# Patient Record
Sex: Female | Born: 1975 | Race: Black or African American | Hispanic: No | Marital: Single | Smoking: Current every day smoker
Health system: Southern US, Community
[De-identification: ages and names within clinical notes are randomized; demographics above are authoritative.]

## PROBLEM LIST (undated history)

## (undated) DIAGNOSIS — K219 Gastro-esophageal reflux disease without esophagitis: Secondary | ICD-10-CM

## (undated) DIAGNOSIS — K279 Peptic ulcer, site unspecified, unspecified as acute or chronic, without hemorrhage or perforation: Secondary | ICD-10-CM

## (undated) DIAGNOSIS — I10 Essential (primary) hypertension: Secondary | ICD-10-CM

## (undated) DIAGNOSIS — D509 Iron deficiency anemia, unspecified: Secondary | ICD-10-CM

## (undated) DIAGNOSIS — N189 Chronic kidney disease, unspecified: Secondary | ICD-10-CM

## (undated) HISTORY — DX: Iron deficiency anemia, unspecified: D50.9

## (undated) HISTORY — DX: Peptic ulcer, site unspecified, unspecified as acute or chronic, without hemorrhage or perforation: K27.9

## (undated) HISTORY — DX: Gastro-esophageal reflux disease without esophagitis: K21.9

---

## 2013-10-13 ENCOUNTER — Emergency Department: Payer: Self-pay | Admitting: Emergency Medicine

## 2013-10-14 LAB — URINALYSIS, COMPLETE
Bacteria: NONE SEEN
Bilirubin,UR: NEGATIVE
Glucose,UR: NEGATIVE mg/dL (ref 0–75)
Ketone: NEGATIVE
Nitrite: NEGATIVE
Ph: 6 (ref 4.5–8.0)
Protein: 100
Specific Gravity: 1.006 (ref 1.003–1.030)
Squamous Epithelial: 5
WBC UR: 27 /HPF (ref 0–5)

## 2013-10-14 LAB — WET PREP, GENITAL

## 2013-10-16 LAB — URINE CULTURE

## 2014-02-24 ENCOUNTER — Emergency Department: Payer: Self-pay | Admitting: Emergency Medicine

## 2016-06-28 ENCOUNTER — Emergency Department
Admission: EM | Admit: 2016-06-28 | Discharge: 2016-06-28 | Disposition: A | Discharge status: Home / Self Care | Payer: Medicaid Other | Attending: Emergency Medicine | Admitting: Emergency Medicine

## 2016-06-28 ENCOUNTER — Emergency Department: Payer: Medicaid Other

## 2016-06-28 ENCOUNTER — Encounter: Payer: Self-pay | Admitting: Emergency Medicine

## 2016-06-28 DIAGNOSIS — R42 Dizziness and giddiness: Secondary | ICD-10-CM | POA: Insufficient documentation

## 2016-06-28 DIAGNOSIS — F17200 Nicotine dependence, unspecified, uncomplicated: Secondary | ICD-10-CM | POA: Insufficient documentation

## 2016-06-28 DIAGNOSIS — I10 Essential (primary) hypertension: Secondary | ICD-10-CM | POA: Diagnosis not present

## 2016-06-28 HISTORY — DX: Essential (primary) hypertension: I10

## 2016-06-28 LAB — COMPREHENSIVE METABOLIC PANEL
ALK PHOS: 79 U/L (ref 38–126)
ALT: 15 U/L (ref 14–54)
ANION GAP: 9 (ref 5–15)
AST: 34 U/L (ref 15–41)
Albumin: 3.7 g/dL (ref 3.5–5.0)
BILIRUBIN TOTAL: 1.2 mg/dL (ref 0.3–1.2)
BUN: 16 mg/dL (ref 6–20)
CALCIUM: 9.4 mg/dL (ref 8.9–10.3)
CO2: 18 mmol/L — ABNORMAL LOW (ref 22–32)
Chloride: 109 mmol/L (ref 101–111)
Creatinine, Ser: 1.99 mg/dL — ABNORMAL HIGH (ref 0.44–1.00)
GFR calc non Af Amer: 30 mL/min — ABNORMAL LOW (ref >60)
GFR, EST AFRICAN AMERICAN: 35 mL/min — AB (ref >60)
Glucose, Bld: 76 mg/dL (ref 65–99)
Potassium: 4.8 mmol/L (ref 3.5–5.1)
Sodium: 136 mmol/L (ref 135–145)
TOTAL PROTEIN: 8.3 g/dL — AB (ref 6.5–8.1)

## 2016-06-28 LAB — CBC
HCT: 33.9 % — ABNORMAL LOW (ref 35.0–47.0)
HEMOGLOBIN: 10.6 g/dL — AB (ref 12.0–16.0)
MCH: 25.3 pg — ABNORMAL LOW (ref 26.0–34.0)
MCHC: 31.3 g/dL — ABNORMAL LOW (ref 32.0–36.0)
MCV: 80.9 fL (ref 80.0–100.0)
Platelets: 355 10*3/uL (ref 150–440)
RBC: 4.19 MIL/uL (ref 3.80–5.20)
RDW: 19 % — ABNORMAL HIGH (ref 11.5–14.5)
WBC: 7.5 10*3/uL (ref 3.6–11.0)

## 2016-06-28 LAB — TROPONIN I

## 2016-06-28 MED ORDER — SODIUM CHLORIDE 0.9 % IV BOLUS (SEPSIS)
1000.0000 mL | Freq: Once | INTRAVENOUS | Status: AC
  Administered 2016-06-28: 1000 mL via INTRAVENOUS

## 2016-06-28 MED ORDER — MECLIZINE HCL 25 MG PO TABS: 25.0000 mg | ORAL_TABLET | Freq: Three times a day (TID) | ORAL | 0 refills | Status: DC | PRN

## 2016-06-28 MED ORDER — MECLIZINE HCL 25 MG PO TABS
ORAL_TABLET | ORAL | Status: AC
  Administered 2016-06-28: 25 mg via ORAL
  Filled 2016-06-28: qty 1

## 2016-06-28 MED ORDER — MECLIZINE HCL 25 MG PO TABS
25.0000 mg | ORAL_TABLET | Freq: Once | ORAL | Status: AC
  Administered 2016-06-28: 25 mg via ORAL

## 2016-06-28 NOTE — ED Notes (Signed)
Patient discharged to home per MD order. Patient in stable condition, and deemed medically cleared by ED provider for discharge. Discharge instructions reviewed with patient/family using "Teach Back"; verbalized understanding of medication education and administration, and information about follow-up care. Denies further concerns. ° °

## 2016-06-28 NOTE — ED Provider Notes (Signed)
[facility] Emergency Department Provider Note   ____________________________________________   I have reviewed the triage vital signs and the nursing notes.   HISTORY  Chief Complaint Dizziness   History limited by: Not Limited   HPI Gina Doyle is a 41 y.o. female who presents to the emergency department today because of concerns for dizziness. The patient states that this started a couple of days ago. She feels like the room is spinning around her. She first noticed in the middle the night when she went up to use the bathroom. Since then it has occurred a few more times. She was able however to go to work today and finish her shift. In addition she states she has had right ear issues, with hearing sounds, which has been going on for a year.   Past Medical History:  Diagnosis Date  . Hypertension     There are no active problems to display for this patient.   History reviewed. No pertinent surgical history.  Prior to Admission medications   Not on File    Allergies Patient has no known allergies.  No family history on file.  Social History Social History  Substance Use Topics  . Smoking status: Current Every Day Smoker    Packs/day: 1.00    Types: Cigarettes  . Smokeless tobacco: Not on file  . Alcohol use Not on file    Review of Systems  Constitutional: Negative for fever. Cardiovascular: Negative for chest pain. Respiratory: Negative for shortness of breath. Gastrointestinal: Negative for abdominal pain, vomiting and diarrhea. Neurological: Positive for dizziness. 10-point ROS otherwise negative.  ____________________________________________   PHYSICAL EXAM:  VITAL SIGNS: ED Triage Vitals  Enc Vitals Group     BP 06/28/16 1806 (!) 154/96     Pulse Rate 06/28/16 1806 80     Resp 06/28/16 1806 20     Temp 06/28/16 1806 98 F (36.7 C)     Temp Source 06/28/16 1806 Oral     SpO2 06/28/16 1806 100 %     Weight 06/28/16  1807 220 lb (99.8 kg)     Height 06/28/16 1807 5\' 10"  (1.778 m)     Head Circumference --      Peak Flow --      Pain Score 06/28/16 1805 8   Constitutional: Alert and oriented. Well appearing and in no distress. Eyes: Conjunctivae are normal. Normal extraocular movements. ENT   Head: Normocephalic and atraumatic.   Nose: No congestion/rhinnorhea.   Mouth/Throat: Mucous membranes are moist.   Neck: No stridor. Hematological/Lymphatic/Immunilogical: No cervical lymphadenopathy. Cardiovascular: Normal rate, regular rhythm.  No murmurs, rubs, or gallops. Respiratory: Normal respiratory effort without tachypnea nor retractions. Breath sounds are clear and equal bilaterally. No wheezes/rales/rhonchi. Gastrointestinal: Soft and non tender. No rebound. No guarding.  Genitourinary: Deferred Musculoskeletal: Normal range of motion in all extremities. No lower extremity edema. Neurologic:  Normal speech and language. No gross focal neurologic deficits are appreciated.  Skin:  Skin is warm, dry and intact. No rash noted. Psychiatric: Mood and affect are normal. Speech and behavior are normal. Patient exhibits appropriate insight and judgment.  ____________________________________________    LABS (pertinent positives/negatives)  Labs Reviewed  COMPREHENSIVE METABOLIC PANEL - Abnormal; Notable for the following:       Result Value   CO2 18 (*)    Creatinine, Ser 1.99 (*)    Total Protein 8.3 (*)    GFR calc non Af Amer 30 (*)    GFR calc  Af Amer 35 (*)    All other components within normal limits  CBC - Abnormal; Notable for the following:    Hemoglobin 10.6 (*)    HCT 33.9 (*)    MCH 25.3 (*)    MCHC 31.3 (*)    RDW 19.0 (*)    All other components within normal limits  TROPONIN I     ____________________________________________   EKG  I, Nance Pear, attending physician, personally viewed and interpreted this EKG  EKG Time: 1937 Rate: 59 Rhythm: sinus  bradycardia Axis: normal Intervals: qtc 409 QRS: narrow ST changes: no st elevation Impression: abnormal ekg   ____________________________________________    RADIOLOGY  CT head  IMPRESSION: No acute intracranial abnormality.  Trace fluid identified right mastoid air cells .   ____________________________________________   PROCEDURES  Procedures  ____________________________________________   INITIAL IMPRESSION / ASSESSMENT AND PLAN / ED COURSE  Pertinent labs & imaging results that were available during my care of the patient were reviewed by me and considered in my medical decision making (see chart for details).  Patient presented to the emergency department today with primary concern for dizziness. It is intermittent. No concerning findings on physical exam. At this point I doubt central lesion given intermittent nature symptoms. She was mildly anemic however don't think it would be enough to cause the patient's symptoms. This point given the additional history of right ear issues like vertigo likely. Patient will be given meclizine. Furthermore patient's creatinine was elevated. I had a discussion with patient about this. Patient states it has happened to her in the past. Patient will follow-up with her primary care doctor to have it rechecked.  ____________________________________________   FINAL CLINICAL IMPRESSION(S) / ED DIAGNOSES  Final diagnoses:  Dizziness     Note: This dictation was prepared with Dragon dictation. Any transcriptional errors that result from this process are unintentional     Nance Pear, MD 06/28/16 2153

## 2016-06-28 NOTE — ED Notes (Signed)
Pt in with co dizziness since Saturday no hx of the same, states no loc. Pt denies any recent illness or cause, also has had some cp denies any at present.

## 2016-06-28 NOTE — ED Triage Notes (Signed)
States 2 days ago got up from bed and felt very dizzy and went to ground. Did not lose consciousness. Today several times has episodes of dizziness when getting up, had to rest for it to pass. Headache began today. Has been hearing strange sounds R ear only for past year.

## 2016-06-28 NOTE — Discharge Instructions (Signed)
Please seek medical attention for any high fevers, chest pain, shortness of breath, change in behavior, persistent vomiting, bloody stool or any other new or concerning symptoms.  

## 2017-07-26 ENCOUNTER — Encounter: Payer: Self-pay | Admitting: Emergency Medicine

## 2017-07-26 ENCOUNTER — Emergency Department
Admission: EM | Admit: 2017-07-26 | Discharge: 2017-07-26 | Disposition: A | Discharge status: Home / Self Care | Payer: Self-pay | Attending: Emergency Medicine | Admitting: Emergency Medicine

## 2017-07-26 DIAGNOSIS — R7989 Other specified abnormal findings of blood chemistry: Secondary | ICD-10-CM

## 2017-07-26 DIAGNOSIS — R748 Abnormal levels of other serum enzymes: Secondary | ICD-10-CM | POA: Insufficient documentation

## 2017-07-26 DIAGNOSIS — R778 Other specified abnormalities of plasma proteins: Secondary | ICD-10-CM

## 2017-07-26 DIAGNOSIS — R0789 Other chest pain: Secondary | ICD-10-CM | POA: Insufficient documentation

## 2017-07-26 DIAGNOSIS — Z79899 Other long term (current) drug therapy: Secondary | ICD-10-CM | POA: Insufficient documentation

## 2017-07-26 DIAGNOSIS — I10 Essential (primary) hypertension: Secondary | ICD-10-CM | POA: Insufficient documentation

## 2017-07-26 DIAGNOSIS — F17210 Nicotine dependence, cigarettes, uncomplicated: Secondary | ICD-10-CM | POA: Insufficient documentation

## 2017-07-26 LAB — COMPREHENSIVE METABOLIC PANEL
ALT: 20 U/L (ref 14–54)
ANION GAP: 5 (ref 5–15)
AST: 27 U/L (ref 15–41)
Albumin: 3.2 g/dL — ABNORMAL LOW (ref 3.5–5.0)
Alkaline Phosphatase: 87 U/L (ref 38–126)
BUN: 16 mg/dL (ref 6–20)
CHLORIDE: 111 mmol/L (ref 101–111)
CO2: 20 mmol/L — ABNORMAL LOW (ref 22–32)
Calcium: 8.8 mg/dL — ABNORMAL LOW (ref 8.9–10.3)
Creatinine, Ser: 1.79 mg/dL — ABNORMAL HIGH (ref 0.44–1.00)
GFR calc Af Amer: 39 mL/min — ABNORMAL LOW (ref >60)
GFR, EST NON AFRICAN AMERICAN: 34 mL/min — AB (ref >60)
Glucose, Bld: 102 mg/dL — ABNORMAL HIGH (ref 65–99)
POTASSIUM: 4.2 mmol/L (ref 3.5–5.1)
Sodium: 136 mmol/L (ref 135–145)
Total Bilirubin: 0.5 mg/dL (ref 0.3–1.2)
Total Protein: 7.4 g/dL (ref 6.5–8.1)

## 2017-07-26 LAB — CBC
HCT: 32.2 % — ABNORMAL LOW (ref 35.0–47.0)
Hemoglobin: 10.5 g/dL — ABNORMAL LOW (ref 12.0–16.0)
MCH: 25.9 pg — ABNORMAL LOW (ref 26.0–34.0)
MCHC: 32.5 g/dL (ref 32.0–36.0)
MCV: 79.6 fL — AB (ref 80.0–100.0)
PLATELETS: 425 10*3/uL (ref 150–440)
RBC: 4.05 MIL/uL (ref 3.80–5.20)
RDW: 19.2 % — AB (ref 11.5–14.5)
WBC: 5.3 10*3/uL (ref 3.6–11.0)

## 2017-07-26 LAB — TROPONIN I
TROPONIN I: 0.04 ng/mL — AB (ref <0.03)
Troponin I: 0.04 ng/mL (ref <0.03)

## 2017-07-26 MED ORDER — ATENOLOL 50 MG PO TABS: 50.0000 mg | ORAL_TABLET | Freq: Every day | ORAL | 11 refills | Status: DC

## 2017-07-26 NOTE — ED Notes (Signed)
While conducting home medication interview, patient reported taking omeprazole for frequent stomach acid but said that it did not seem to help. She inquired as to other possible medications she could use. I advised that there were other OTC medications in the same family as omeprazole (e.g. Nexium and Prevacid), but that no one was necessarily "stronger" than another and that people respond to medications differently. I advised speaking with a PCP or specialist if the problem continued chronically and she indicated understanding.   ** The above is intended solely for informational and/or communicative purposes. It should in no way be considered an endorsement of any specific treatment, therapy or action. **

## 2017-07-26 NOTE — ED Provider Notes (Signed)
[facility] Emergency Department Provider Note  ____________________________________________   First MD Initiated Contact with Patient 07/26/17 1324     (approximate)  I have reviewed the triage vital signs and the nursing notes.   HISTORY  Chief Complaint Breast Pain    HPI Gina Doyle is a 42 y.o. female with PMH as listed below who presents for evaluation of left sided breast vs chest wall pain.  She reports that she has felt similar symptoms in the past but is usually milder.  It is reproducible with moving around her left arm and the left side of her chest and it often feels better if she pushes firmly on the lateral aspect of her left breast.  She does some repetitive motions at work and also lifts up her small child and says that this reproduces the pain as well.  She has no "chest pain on the inside" although the pain is deep in her left chest wall rather than being on the surface.  She denies shortness of breath, fever/chills, nausea, vomiting, and abdominal pain.  She reports that she ran out of her atenolol a couple weeks ago and has no regular doctor to get another prescription so she is worried about her blood pressure.  She does not have any chest tightness or pressure, just the sharp reproducible pain.  It only occurs if she is moving around and it is relatively mild.  Past Medical History:  Diagnosis Date  . Hypertension     There are no active problems to display for this patient.   History reviewed. No pertinent surgical history.  Prior to Admission medications   Medication Sig Start Date End Date Taking? Authorizing Provider  atenolol (TENORMIN) 50 MG tablet Take 50 mg by mouth daily.   Yes [provider]  omeprazole (PRILOSEC OTC) 20 MG tablet Take 20 mg by mouth daily.   Yes [provider]  ranitidine (ZANTAC) 150 MG tablet Take 150 mg by mouth 2 (two) times daily as needed for heartburn.   Yes [provider]  atenolol (TENORMIN) 50 MG tablet Take 1 tablet (50 mg total) by mouth daily. 07/26/17 07/26/18  Hinda Kehr, MD    Allergies Patient has no known allergies.  No family history on file.  Social History Social History   Tobacco Use  . Smoking status: Current Every Day Smoker    Packs/day: 1.00    Types: Cigarettes  Substance Use Topics  . Alcohol use: Not on file  . Drug use: Not on file    Review of Systems Constitutional: No fever/chills Eyes: No visual changes. ENT: No sore throat. Cardiovascular: Left-sided chest wall pain Respiratory: Denies shortness of breath. Gastrointestinal: No abdominal pain.  No nausea, no vomiting.  No diarrhea.  No constipation. Genitourinary: Negative for dysuria. Musculoskeletal: Negative for neck pain.  Negative for back pain. Integumentary: Negative for rash. Neurological: Negative for headaches, focal weakness or numbness.   ____________________________________________   PHYSICAL EXAM:  VITAL SIGNS: ED Triage Vitals  Enc Vitals Group     BP 07/26/17 1030 (!) (P) 151/100     Pulse Rate 07/26/17 1032 (!) 46     Resp 07/26/17 1032 16     Temp 07/26/17 1030 (P) 98.7 F (37.1 C)     Temp Source 07/26/17 1030 (P) Oral     SpO2 07/26/17 1032 100 %     Weight 07/26/17 1039 97.5 kg (215 lb)     Height 07/26/17 1039  1.778 m (5\' 10" )     Head Circumference --      Peak Flow --      Pain Score 07/26/17 1034 8     Pain Loc --      Pain Edu? --      Excl. in [place]? --     Constitutional: Alert and oriented. Well appearing and in no acute distress. Eyes: Conjunctivae are normal.  Head: Atraumatic. Nose: No congestion/rhinnorhea. Mouth/Throat: Mucous membranes are moist. Neck: No stridor.  No meningeal signs.   Cardiovascular: Normal rate, regular rhythm. Good peripheral circulation. Grossly normal heart sounds.  Highly reproducible left lateral chest wall/breast pain.  There is no palpable deformity or mass, no  indication of abscess nor cellulitis, no discharge from the patient's nipple.  The area of tenderness seems to be the muscles within her left chest wall. Respiratory: Normal respiratory effort.  No retractions. Lungs CTAB. Gastrointestinal: Soft and nontender. No distention.  Musculoskeletal: No lower extremity tenderness nor edema. No gross deformities of extremities. Neurologic:  Normal speech and language. No gross focal neurologic deficits are appreciated.  Skin:  Skin is warm, dry and intact. No rash noted. Psychiatric: Mood and affect are normal. Speech and behavior are normal.  ____________________________________________   LABS (all labs ordered are listed, but only abnormal results are displayed)  Labs Reviewed  CBC - Abnormal; Notable for the following components:      Result Value   Hemoglobin 10.5 (*)    HCT 32.2 (*)    MCV 79.6 (*)    MCH 25.9 (*)    RDW 19.2 (*)    All other components within normal limits  COMPREHENSIVE METABOLIC PANEL - Abnormal; Notable for the following components:   CO2 20 (*)    Glucose, Bld 102 (*)    Creatinine, Ser 1.79 (*)    Calcium 8.8 (*)    Albumin 3.2 (*)    GFR calc non Af Amer 34 (*)    GFR calc Af Amer 39 (*)    All other components within normal limits  TROPONIN I - Abnormal; Notable for the following components:   Troponin I 0.04 (*)    All other components within normal limits  TROPONIN I - Abnormal; Notable for the following components:   Troponin I 0.04 (*)    All other components within normal limits   ____________________________________________  EKG  ED ECG REPORT I, Hinda Kehr, the attending physician, personally viewed and interpreted this ECG.  Date: 07/26/2017 EKG Time: 10:37 Rate: 47 Rhythm: sinus bradycardia QRS Axis: normal Intervals: normal ST/T Wave abnormalities: Non-specific ST segment / T-wave changes, but no evidence of acute ischemia. Narrative Interpretation: no evidence of acute  ischemia   ____________________________________________  RADIOLOGY   ED MD interpretation:  No indication for imaging  Official radiology report(s): No results found.  ____________________________________________   PROCEDURES  Critical Care performed: No   Procedure(s) performed:   Procedures   ____________________________________________   INITIAL IMPRESSION / ASSESSMENT AND PLAN / ED COURSE  As part of my medical decision making, I reviewed the following data within the [place] notes reviewed and incorporated, Labs reviewed  and EKG interpreted     Differential diagnosis includes, but is not limited to, musculoskeletal chest wall pain/strain, ACS, pneumothorax, pneumonia, breast abscess, breast cellulitis, etc.  Fortunately the patient's physical exam is strongly consistent with musculoskeletal tenderness.  After we talked a little bit about the repetitive motions of her job and lifting  her child, she agrees this seems most likely.  She is bradycardic but asymptomatic from that and her blood pressure is elevated but she has been off of her atenolol for a while.  She is eating in the exam room some food that her family brought her and is in no apparent distress or discomfort with highly reproducible pain.  I suggested that we add on a troponin which was not done in triage and she said that is fine but she does not want to wait for the results.  I have added on and I will check for the result but given the strong probability of musculoskeletal pain and the fact the patient wants to go, I will not hold her until the result comes back.  I encouraged over-the-counter OTC analgesia and  I gave my usual and customary return precautions.  She understands and agrees with the plan.   Clinical Course as of Jul 27 1647  Tue Jul 26, 2017  1533 The patient is still in the ED.  Her first troponin was 0.04 which I suspect is simply an elevation due to her chronic  kidney disease.  However, I went back and discussed the results and she is comfortable staying for a repeat troponin.  I think that is appropriate as a screening test to make sure it is not going up significantly.  The second troponin has been ordered and I will reassess the appropriate disposition after the result comes back.   [CF]  4132 Second troponin has not changed and is still 0.04.  I am sure this is a result of her chronic kidney disease.  Patient is comfortable with plan for discharge and outpatient follow-up.  I gave my usual customary return precautions.   [CF]    Clinical Course User Index [CF] Hinda Kehr, MD    ____________________________________________  FINAL CLINICAL IMPRESSION(S) / ED DIAGNOSES  Final diagnoses:  Chest wall pain  Essential hypertension  Elevated troponin I level     MEDICATIONS GIVEN DURING THIS VISIT:  Medications - No data to display   ED Discharge Orders        Ordered    atenolol (TENORMIN) 50 MG tablet  Daily     07/26/17 1510       Note:  This document was prepared using Dragon voice recognition software and may include unintentional dictation errors.    Hinda Kehr, MD 07/26/17 [phone]

## 2017-07-26 NOTE — Discharge Instructions (Signed)

## 2017-07-26 NOTE — ED Triage Notes (Signed)
Pt reports last pm started with some pain to her left breast. Pt states the pain is sharp and she has to squeeze it to make it feel better.

## 2017-07-26 NOTE — ED Notes (Signed)
Dr. Karma Greaser aware of troponin of 0.04.

## 2017-09-28 ENCOUNTER — Encounter: Payer: Self-pay | Admitting: Emergency Medicine

## 2017-09-28 ENCOUNTER — Emergency Department
Admission: EM | Admit: 2017-09-28 | Discharge: 2017-09-28 | Disposition: A | Discharge status: Home / Self Care | Payer: Self-pay | Attending: Emergency Medicine | Admitting: Emergency Medicine

## 2017-09-28 DIAGNOSIS — L739 Follicular disorder, unspecified: Secondary | ICD-10-CM | POA: Insufficient documentation

## 2017-09-28 DIAGNOSIS — R238 Other skin changes: Secondary | ICD-10-CM

## 2017-09-28 DIAGNOSIS — Z79899 Other long term (current) drug therapy: Secondary | ICD-10-CM | POA: Insufficient documentation

## 2017-09-28 DIAGNOSIS — F17210 Nicotine dependence, cigarettes, uncomplicated: Secondary | ICD-10-CM | POA: Insufficient documentation

## 2017-09-28 DIAGNOSIS — I10 Essential (primary) hypertension: Secondary | ICD-10-CM | POA: Insufficient documentation

## 2017-09-28 MED ORDER — FLUCONAZOLE 150 MG PO TABS: 150.0000 mg | ORAL_TABLET | Freq: Every day | ORAL | 0 refills | Status: DC

## 2017-09-28 MED ORDER — CEPHALEXIN 500 MG PO CAPS: 500.0000 mg | ORAL_CAPSULE | Freq: Three times a day (TID) | ORAL | 0 refills | Status: DC

## 2017-09-28 NOTE — ED Triage Notes (Signed)
Pt reports abscess to left labia for the past few weeks painful now. Pt admits to trying to mess with it. Denies fevers.

## 2017-09-28 NOTE — ED Provider Notes (Signed)
[facility] Emergency Department Provider Note   ____________________________________________   First MD Initiated Contact with Patient 09/28/17 1406     (approximate)  I have reviewed the triage vital signs and the nursing notes.   HISTORY  Chief Complaint Abscess   HPI Gina Doyle is a 42 y.o. female 0 complaint of a small abscess that has been there for approximately 3 weeks.  Patient admits she put a pin in the area.  She is also been using warm compresses and also sitting in a tub water.  She denies any fever or chills.  She states that she is not seen any drainage from this area.   Past Medical History:  Diagnosis Date  . Hypertension     There are no active problems to display for this patient.   History reviewed. No pertinent surgical history.  Prior to Admission medications   Medication Sig Start Date End Date Taking? Authorizing Provider  atenolol (TENORMIN) 50 MG tablet Take 1 tablet (50 mg total) by mouth daily. 07/26/17 07/26/18  Hinda Kehr, MD  atenolol (TENORMIN) 50 MG tablet Take 50 mg by mouth daily.    [provider]  cephALEXin (KEFLEX) 500 MG capsule Take 1 capsule (500 mg total) by mouth 3 (three) times daily. 09/28/17   Johnn Hai, PA-C  fluconazole (DIFLUCAN) 150 MG tablet Take 1 tablet (150 mg total) by mouth daily. 09/28/17   Johnn Hai, PA-C  omeprazole (PRILOSEC OTC) 20 MG tablet Take 20 mg by mouth daily.    [provider]  ranitidine (ZANTAC) 150 MG tablet Take 150 mg by mouth 2 (two) times daily as needed for heartburn.    [provider]    Allergies Patient has no known allergies.  No family history on file.  Social History Social History   Tobacco Use  . Smoking status: Current Every Day Smoker    Packs/day: 1.00    Types: Cigarettes  Substance Use Topics  . Alcohol use: Not on file  . Drug use: Not on file    Review of Systems Constitutional: No  fever/chills Cardiovascular: Denies chest pain. Respiratory: Denies shortness of breath. Skin: Positive for questionable abscess. ____________________________________________   PHYSICAL EXAM:  VITAL SIGNS: ED Triage Vitals  Enc Vitals Group     BP 09/28/17 1348 (!) 151/95     Pulse Rate 09/28/17 1348 61     Resp 09/28/17 1348 20     Temp 09/28/17 1348 98.4 F (36.9 C)     Temp Source 09/28/17 1348 Oral     SpO2 09/28/17 1348 99 %     Weight 09/28/17 1349 207 lb (93.9 kg)     Height 09/28/17 1349 5\' 10"  (1.778 m)     Head Circumference --      Peak Flow --      Pain Score 09/28/17 1348 8     Pain Loc --      Pain Edu? --      Excl. in [place]? --     Constitutional: Alert and oriented. Well appearing and in no acute distress. Eyes: Conjunctivae are normal. PERRL. EOMI. Head: Atraumatic. Neck: No stridor.   Cardiovascular: Normal rate, regular rhythm. Grossly normal heart sounds.  Good peripheral circulation. Respiratory: Normal respiratory effort.  No retractions. Lungs CTAB. Musculoskeletal: Moves upper and lower extremities without any difficulty.  Normal gait was noted. Neurologic:  Normal speech and language. No gross focal neurologic deficits are appreciated.  Skin:  Skin is warm, dry and intact.  There is a single papule non-erythematous lateral to superior labia and between the upper left thigh.  No abscess was appreciated.  Area was palpated and no drainage is seen. Psychiatric: Mood and affect are normal. Speech and behavior are normal.  ____________________________________________   LABS (all labs ordered are listed, but only abnormal results are displayed)  Labs Reviewed - No data to display  PROCEDURES  Procedure(s) performed: None  Procedures  Critical Care performed: No  ____________________________________________   INITIAL IMPRESSION / ASSESSMENT AND PLAN / ED COURSE  As part of my medical decision making, I reviewed the following data within the  electronic MEDICAL RECORD NUMBER Notes from prior ED visits and [place] Controlled Substance Database  Patient is encouraged to use warm compresses to the area frequently.  She was warned not to make these compresses to hot avoid burning.  She was also given a prescription for Keflex 500 mg 3 times daily for 7 days.  A Diflucan tablet was written also in the event that she develops a yeast infection.  She was given a list of clinics in the area that her on a sliding scale to establish primary care.  ____________________________________________   FINAL CLINICAL IMPRESSION(S) / ED DIAGNOSES  Final diagnoses:  Perifollicular papule     ED Discharge Orders        Ordered    cephALEXin (KEFLEX) 500 MG capsule  3 times daily     09/28/17 1440    fluconazole (DIFLUCAN) 150 MG tablet  Daily     09/28/17 1456       Note:  This document was prepared using Dragon voice recognition software and may include unintentional dictation errors.    Johnn Hai, PA-C 09/28/17 1528    Earleen Newport, MD 09/29/17 1134

## 2017-09-28 NOTE — Discharge Instructions (Signed)
Apply warm moist heat to the area frequently.  Begin taking your antibiotics for the next 7 days.  Do not stop taking these if areas starts showing improvement.  You may take Tylenol or ibuprofen as needed for pain.  [facility] with [facility], [facility] or other clinics listed on your discharge papers.

## 2017-11-27 ENCOUNTER — Emergency Department
Admission: EM | Admit: 2017-11-27 | Discharge: 2017-11-27 | Disposition: A | Discharge status: Home / Self Care | Payer: Self-pay | Attending: Emergency Medicine | Admitting: Emergency Medicine

## 2017-11-27 ENCOUNTER — Other Ambulatory Visit: Payer: Self-pay

## 2017-11-27 ENCOUNTER — Encounter: Payer: Self-pay | Admitting: Emergency Medicine

## 2017-11-27 ENCOUNTER — Emergency Department: Payer: Self-pay

## 2017-11-27 DIAGNOSIS — I10 Essential (primary) hypertension: Secondary | ICD-10-CM | POA: Insufficient documentation

## 2017-11-27 DIAGNOSIS — Z79899 Other long term (current) drug therapy: Secondary | ICD-10-CM | POA: Insufficient documentation

## 2017-11-27 DIAGNOSIS — R51 Headache: Secondary | ICD-10-CM | POA: Insufficient documentation

## 2017-11-27 DIAGNOSIS — F17210 Nicotine dependence, cigarettes, uncomplicated: Secondary | ICD-10-CM | POA: Insufficient documentation

## 2017-11-27 DIAGNOSIS — R519 Headache, unspecified: Secondary | ICD-10-CM

## 2017-11-27 DIAGNOSIS — R001 Bradycardia, unspecified: Secondary | ICD-10-CM | POA: Insufficient documentation

## 2017-11-27 LAB — COMPREHENSIVE METABOLIC PANEL
ALBUMIN: 3.1 g/dL — AB (ref 3.5–5.0)
ALT: 10 U/L (ref 0–44)
ANION GAP: 7 (ref 5–15)
AST: 15 U/L (ref 15–41)
Alkaline Phosphatase: 76 U/L (ref 38–126)
BUN: 26 mg/dL — AB (ref 6–20)
CHLORIDE: 114 mmol/L — AB (ref 98–111)
CO2: 20 mmol/L — ABNORMAL LOW (ref 22–32)
Calcium: 8.9 mg/dL (ref 8.9–10.3)
Creatinine, Ser: 2.28 mg/dL — ABNORMAL HIGH (ref 0.44–1.00)
GFR calc Af Amer: 29 mL/min — ABNORMAL LOW (ref >60)
GFR calc non Af Amer: 25 mL/min — ABNORMAL LOW (ref >60)
GLUCOSE: 83 mg/dL (ref 70–99)
POTASSIUM: 4.3 mmol/L (ref 3.5–5.1)
Sodium: 141 mmol/L (ref 135–145)
Total Bilirubin: 0.4 mg/dL (ref 0.3–1.2)
Total Protein: 7.4 g/dL (ref 6.5–8.1)

## 2017-11-27 LAB — CBC WITH DIFFERENTIAL/PLATELET
BASOS ABS: 0 10*3/uL (ref 0–0.1)
BASOS PCT: 1 %
EOS ABS: 0.3 10*3/uL (ref 0–0.7)
EOS PCT: 4 %
HCT: 31.5 % — ABNORMAL LOW (ref 35.0–47.0)
Hemoglobin: 10.1 g/dL — ABNORMAL LOW (ref 12.0–16.0)
LYMPHS ABS: 2.1 10*3/uL (ref 1.0–3.6)
LYMPHS PCT: 35 %
MCH: 25.7 pg — ABNORMAL LOW (ref 26.0–34.0)
MCHC: 32.1 g/dL (ref 32.0–36.0)
MCV: 80.2 fL (ref 80.0–100.0)
MONOS PCT: 9 %
Monocytes Absolute: 0.5 10*3/uL (ref 0.2–0.9)
Neutro Abs: 3.2 10*3/uL (ref 1.4–6.5)
Neutrophils Relative %: 51 %
PLATELETS: 447 10*3/uL — AB (ref 150–440)
RBC: 3.93 MIL/uL (ref 3.80–5.20)
RDW: 20 % — AB (ref 11.5–14.5)
WBC: 6.2 10*3/uL (ref 3.6–11.0)

## 2017-11-27 MED ORDER — METOCLOPRAMIDE HCL 5 MG/ML IJ SOLN
10.0000 mg | Freq: Once | INTRAMUSCULAR | Status: AC
  Administered 2017-11-27: 10 mg via INTRAVENOUS
  Filled 2017-11-27: qty 2

## 2017-11-27 MED ORDER — DIPHENHYDRAMINE HCL 50 MG/ML IJ SOLN
25.0000 mg | Freq: Once | INTRAMUSCULAR | Status: AC
  Administered 2017-11-27: 25 mg via INTRAVENOUS
  Filled 2017-11-27: qty 1

## 2017-11-27 MED ORDER — METOCLOPRAMIDE HCL 10 MG PO TABS: 10.0000 mg | ORAL_TABLET | Freq: Three times a day (TID) | ORAL | 0 refills | Status: DC | PRN

## 2017-11-27 NOTE — ED Triage Notes (Signed)
Pt to ED via POV c/o migraine. Pt states that she has been having a migraine every day for the last month. Pt states that she takes OTC medication for the migraine and it will go away but then comes back. Pt also c/o fatigue. Pt states that this has been going on for several weeks. Pt noted to be bradycardic in triage at 44, pt states that she take Atenolol 50 mg QHS. Pt states that she has been on this dosage for several years. Pt is in NAD at this time.

## 2017-11-27 NOTE — ED Notes (Signed)
Patient reports her headache is better - she is awake and alert and oriented

## 2017-11-27 NOTE — ED Triage Notes (Signed)
First Nurse Note:  C/O migraine headache to left posterior head x 3 weeks.  States pain has been constant, only lessens briefly after taking "something" for pain.  Patient is AAOx3.  Skin warm and dry.  MAE equally and strong. Gait steady.  MAE equally and strong.

## 2017-11-27 NOTE — ED Provider Notes (Signed)
[facility] Emergency Department Provider Note ____________________________________________   First MD Initiated Contact with Patient 11/27/17 1155     (approximate)  I have reviewed the triage vital signs and the nursing notes.   HISTORY  Chief Complaint Migraine    HPI Gina Doyle is a 42 y.o. female with PMH as noted below who presents with headache, left-sided, Cipro, and occurring over approximately the last 3 weeks.  The patient states that it is briefly resolved with over-the-counter medications but then returns.  She has had similar headaches before but never lasting nearly this long.  She reports that it is throbbing, and associated with nausea but no vomiting and no photophobia.  No trauma to the area.  Patient also reports a feeling of a rattle in her chest when she breathes at night over the last few weeks.  She denies shortness of breath or chest pain.   Past Medical History:  Diagnosis Date  . Hypertension     There are no active problems to display for this patient.   History reviewed. No pertinent surgical history.  Prior to Admission medications   Medication Sig Start Date End Date Taking? Authorizing Provider  atenolol (TENORMIN) 50 MG tablet Take 1 tablet (50 mg total) by mouth daily. 07/26/17 07/26/18  Hinda Kehr, MD  atenolol (TENORMIN) 50 MG tablet Take 50 mg by mouth daily.    [provider]  cephALEXin (KEFLEX) 500 MG capsule Take 1 capsule (500 mg total) by mouth 3 (three) times daily. 09/28/17   Johnn Hai, PA-C  fluconazole (DIFLUCAN) 150 MG tablet Take 1 tablet (150 mg total) by mouth daily. 09/28/17   Johnn Hai, PA-C  metoCLOPramide (REGLAN) 10 MG tablet Take 1 tablet (10 mg total) by mouth every 8 (eight) hours as needed for up to 5 days for nausea (or headache). 11/27/17 12/02/17  Arta Silence, MD  omeprazole (PRILOSEC OTC) 20 MG tablet Take 20 mg by mouth daily.    [provider]    ranitidine (ZANTAC) 150 MG tablet Take 150 mg by mouth 2 (two) times daily as needed for heartburn.    [provider]    Allergies Patient has no known allergies.  No family history on file.  Social History Social History   Tobacco Use  . Smoking status: Current Every Day Smoker    Packs/day: 1.00    Types: Cigarettes  . Smokeless tobacco: Never Used  Substance Use Topics  . Alcohol use: Not Currently  . Drug use: Not Currently    Review of Systems  Constitutional: No fever.  Positive for fatigue. Eyes: No visual changes. ENT: No sore throat. Cardiovascular: Denies chest pain. Respiratory: Denies shortness of breath. Gastrointestinal: Positive for nausea. Genitourinary: Negative for flank pain.  Musculoskeletal: Negative for back pain. Skin: Negative for rash. Neurological: Positive for headache.   ____________________________________________   PHYSICAL EXAM:  VITAL SIGNS: ED Triage Vitals  Enc Vitals Group     BP 11/27/17 1138 (!) 171/90     Pulse Rate 11/27/17 1138 (!) 44     Resp 11/27/17 1138 14     Temp 11/27/17 1138 98.5 F (36.9 C)     Temp Source 11/27/17 1138 Oral     SpO2 11/27/17 1138 99 %     Weight 11/27/17 1138 205 lb (93 kg)     Height 11/27/17 1138 5\' 10"  (1.778 m)     Head Circumference --      Peak Flow --  Pain Score 11/27/17 1144 7     Pain Loc --      Pain Edu? --      Excl. in [place]? --     Constitutional: Alert and oriented. Well appearing and in no acute distress. Eyes: Conjunctivae are normal.  EOMI.  PERRLA. Head: Atraumatic.  Mild tenderness to left occipital area and neck. Nose: No congestion/rhinnorhea. Mouth/Throat: Mucous membranes are moist.   Neck: Normal range of motion.  Cardiovascular: Normal rate, regular rhythm. Grossly normal heart sounds.  Good peripheral circulation. Respiratory: Normal respiratory effort.  No retractions. Lungs CTAB. Gastrointestinal: No distention.  Musculoskeletal:  Extremities warm and well perfused.  Neurologic:  Normal speech and language.  Cranial nerves III through XII intact.  Motor and sensory intact in all extremities.  Normal coordination.  No gross focal neurologic deficits are appreciated.  Skin:  Skin is warm and dry. No rash noted. Psychiatric: Mood and affect are normal. Speech and behavior are normal.  ____________________________________________   LABS (all labs ordered are listed, but only abnormal results are displayed)  Labs Reviewed  CBC WITH DIFFERENTIAL/PLATELET - Abnormal; Notable for the following components:      Result Value   Hemoglobin 10.1 (*)    HCT 31.5 (*)    MCH 25.7 (*)    RDW 20.0 (*)    Platelets 447 (*)    All other components within normal limits  COMPREHENSIVE METABOLIC PANEL - Abnormal; Notable for the following components:   Chloride 114 (*)    CO2 20 (*)    BUN 26 (*)    Creatinine, Ser 2.28 (*)    Albumin 3.1 (*)    GFR calc non Af Amer 25 (*)    GFR calc Af Amer 29 (*)    All other components within normal limits   ____________________________________________  EKG  ED ECG REPORT I, Arta Silence, the attending physician, personally viewed and interpreted this ECG.  Date: 11/27/2017 EKG Time: 1144 Rate: 41 Rhythm: Sinus bradycardia QRS Axis: normal Intervals: normal ST/T Wave abnormalities: normal Narrative Interpretation: no evidence of acute ischemia  ____________________________________________  RADIOLOGY  CT head: No ICH or other acute abnormality CXR: No focal infiltrate  ____________________________________________   PROCEDURES  Procedure(s) performed: No  Procedures  Critical Care performed: No ____________________________________________   INITIAL IMPRESSION / ASSESSMENT AND PLAN / ED COURSE  Pertinent labs & imaging results that were available during my care of the patient were reviewed by me and considered in my medical decision making (see chart for  details).  42 year old female with PMH of hypertension on atenolol presents with occipital left-sided headache over the last several weeks as well as with some sensation of rattling or crackling in her chest when she breathes at night.  On exam, the patient is relatively well-appearing.  She is bradycardic and somewhat hypertensive but her other vital signs are normal.  Neuro exam is nonfocal.  No meningeal signs.  Overall presentation is most consistent with migraine versus tension headache.  Given the duration of the symptoms I will obtain a CT, as well as basic labs and give Reglan and Benadryl for symptomatic treatment.  Because of the patient's respiratory symptoms I will obtain a chest x-ray although her lung exam is normal.  EKG shows sinus bradycardia.  I suspect that this is most likely due to atenolol.  I reviewed prior records in Epic, and the patient's heart rate has been in the 40s previously.  This could be contributing to her fatigue.  There is no indication for acute intervention.  ----------------------------------------- 3:27 PM on 11/27/2017 -----------------------------------------  The patient is feeling much better after Reglan and Benadryl.  CT head and chest x-ray are negative.  The lab work-up is unremarkable.  Her heart rate has gone up to the 50s and then settled back in the 40s but she continues to be asymptomatic from this.  At this time there is no indication for further work-up or observation since her other vital signs are stable.  I instructed her to call her regular doctor tomorrow to arrange for follow-up, and to discontinue the atenolol in the meantime.  At this time she is comfortable and would like to go home.  Return precautions given, and she expresses understanding. ____________________________________________   FINAL CLINICAL IMPRESSION(S) / ED DIAGNOSES  Final diagnoses:  Acute nonintractable headache, unspecified headache type  Bradycardia       NEW MEDICATIONS STARTED DURING THIS VISIT:  New Prescriptions   METOCLOPRAMIDE (REGLAN) 10 MG TABLET    Take 1 tablet (10 mg total) by mouth every 8 (eight) hours as needed for up to 5 days for nausea (or headache).     Note:  This document was prepared using Dragon voice recognition software and may include unintentional dictation errors.    Arta Silence, MD 11/27/17 [phone]

## 2017-11-27 NOTE — Discharge Instructions (Signed)
Call your primary care doctor tomorrow to arrange for a follow-up appointment as soon as possible.  Your heart rate is very low and I am concerned that this is a side effect of your atenolol.  You may need to be switched to a different blood pressure medication.  We recommend to stop the atenolol until you speak to your doctor.  You can take the Reglan as needed for headache or nausea.  We have provided you with a referral to a neurologist as well.  Return to the ER for new, worsening, persistent severe headache, shortness of breath, lightheadedness, weakness, or any other new or worsening symptoms that concern you.

## 2018-04-19 ENCOUNTER — Emergency Department: Payer: Self-pay

## 2018-04-19 ENCOUNTER — Emergency Department
Admission: EM | Admit: 2018-04-19 | Discharge: 2018-04-19 | Disposition: A | Discharge status: Home / Self Care | Payer: Self-pay | Attending: Emergency Medicine | Admitting: Emergency Medicine

## 2018-04-19 ENCOUNTER — Other Ambulatory Visit: Payer: Self-pay

## 2018-04-19 DIAGNOSIS — L0291 Cutaneous abscess, unspecified: Secondary | ICD-10-CM

## 2018-04-19 DIAGNOSIS — N764 Abscess of vulva: Secondary | ICD-10-CM | POA: Insufficient documentation

## 2018-04-19 DIAGNOSIS — I10 Essential (primary) hypertension: Secondary | ICD-10-CM | POA: Insufficient documentation

## 2018-04-19 DIAGNOSIS — F17210 Nicotine dependence, cigarettes, uncomplicated: Secondary | ICD-10-CM | POA: Insufficient documentation

## 2018-04-19 LAB — COMPREHENSIVE METABOLIC PANEL
ALT: 12 U/L (ref 0–44)
ANION GAP: 6 (ref 5–15)
AST: 19 U/L (ref 15–41)
Albumin: 3.2 g/dL — ABNORMAL LOW (ref 3.5–5.0)
Alkaline Phosphatase: 74 U/L (ref 38–126)
BUN: 17 mg/dL (ref 6–20)
CHLORIDE: 107 mmol/L (ref 98–111)
CO2: 22 mmol/L (ref 22–32)
Calcium: 8.8 mg/dL — ABNORMAL LOW (ref 8.9–10.3)
Creatinine, Ser: 2.25 mg/dL — ABNORMAL HIGH (ref 0.44–1.00)
GFR calc Af Amer: 30 mL/min — ABNORMAL LOW (ref >60)
GFR, EST NON AFRICAN AMERICAN: 26 mL/min — AB (ref >60)
Glucose, Bld: 69 mg/dL — ABNORMAL LOW (ref 70–99)
POTASSIUM: 3.7 mmol/L (ref 3.5–5.1)
Sodium: 135 mmol/L (ref 135–145)
Total Bilirubin: 0.4 mg/dL (ref 0.3–1.2)
Total Protein: 7.8 g/dL (ref 6.5–8.1)

## 2018-04-19 LAB — CBC WITH DIFFERENTIAL/PLATELET
Abs Immature Granulocytes: 0.02 10*3/uL (ref 0.00–0.07)
BASOS ABS: 0 10*3/uL (ref 0.0–0.1)
BASOS PCT: 0 %
EOS ABS: 0.1 10*3/uL (ref 0.0–0.5)
EOS PCT: 2 %
HEMATOCRIT: 31.2 % — AB (ref 36.0–46.0)
Hemoglobin: 9.1 g/dL — ABNORMAL LOW (ref 12.0–15.0)
IMMATURE GRANULOCYTES: 0 %
LYMPHS ABS: 1.6 10*3/uL (ref 0.7–4.0)
Lymphocytes Relative: 22 %
MCH: 22.6 pg — ABNORMAL LOW (ref 26.0–34.0)
MCHC: 29.2 g/dL — AB (ref 30.0–36.0)
MCV: 77.6 fL — AB (ref 80.0–100.0)
MONOS PCT: 5 %
Monocytes Absolute: 0.4 10*3/uL (ref 0.1–1.0)
NRBC: 0 % (ref 0.0–0.2)
Neutro Abs: 5.2 10*3/uL (ref 1.7–7.7)
Neutrophils Relative %: 71 %
PLATELETS: 460 10*3/uL — AB (ref 150–400)
RBC: 4.02 MIL/uL (ref 3.87–5.11)
RDW: 19.2 % — AB (ref 11.5–15.5)
WBC: 7.3 10*3/uL (ref 4.0–10.5)

## 2018-04-19 MED ORDER — FENTANYL CITRATE (PF) 100 MCG/2ML IJ SOLN
50.0000 ug | Freq: Once | INTRAMUSCULAR | Status: AC
  Administered 2018-04-19: 50 ug via INTRAVENOUS
  Filled 2018-04-19: qty 2

## 2018-04-19 MED ORDER — OXYCODONE-ACETAMINOPHEN 5-325 MG PO TABS: 1.0000 | ORAL_TABLET | Freq: Two times a day (BID) | ORAL | 0 refills | Status: AC

## 2018-04-19 MED ORDER — BENZOCAINE 20 % MT AERO
INHALATION_SPRAY | Freq: Once | OROMUCOSAL | Status: AC
  Administered 2018-04-19: 21:00:00 via OROMUCOSAL
  Filled 2018-04-19: qty 57

## 2018-04-19 MED ORDER — LIDOCAINE HCL (PF) 1 % IJ SOLN
5.0000 mL | Freq: Once | INTRAMUSCULAR | Status: AC
  Administered 2018-04-19: 5 mL
  Filled 2018-04-19: qty 5

## 2018-04-19 MED ORDER — CLINDAMYCIN HCL 150 MG PO CAPS: ORAL_CAPSULE | ORAL | 0 refills | Status: DC

## 2018-04-19 MED ORDER — OXYCODONE-ACETAMINOPHEN 5-325 MG PO TABS: 1.0000 | ORAL_TABLET | Freq: Four times a day (QID) | ORAL | 0 refills | Status: DC | PRN

## 2018-04-19 MED ORDER — CLINDAMYCIN PHOSPHATE 600 MG/50ML IV SOLN
600.0000 mg | Freq: Once | INTRAVENOUS | Status: AC
  Administered 2018-04-19: 600 mg via INTRAVENOUS
  Filled 2018-04-19: qty 50

## 2018-04-19 NOTE — ED Notes (Signed)
Patient reports pain and knot to left labia. Denies any drainage.

## 2018-04-19 NOTE — ED Provider Notes (Signed)
[facility] Emergency Department Provider Note   ____________________________________________   None    (approximate)  I have reviewed the triage vital signs and the nursing notes.   HISTORY  Chief Complaint possible cyst   HPI Gina Doyle is a 43 y.o. female presents to the ED with possible abscess to her left labia.  Patient states that she noticed it approximately 3 days ago but is gotten larger and more painful.  She denies any trauma to the area and there is been no vaginal discharge.  Patient was able to work today.  Currently she rates her pain as a 10/10.    Past Medical History:  Diagnosis Date  . Hypertension     There are no active problems to display for this patient.   History reviewed. No pertinent surgical history.  Prior to Admission medications   Medication Sig Start Date End Date Taking? Authorizing Provider  atenolol (TENORMIN) 50 MG tablet Take 1 tablet (50 mg total) by mouth daily. 07/26/17 07/26/18  Hinda Kehr, MD  atenolol (TENORMIN) 50 MG tablet Take 50 mg by mouth daily.    [provider]  clindamycin (CLEOCIN) 150 MG capsule 2 caps every 8 hours 04/19/18   Letitia Neri L, PA-C  metoCLOPramide (REGLAN) 10 MG tablet Take 1 tablet (10 mg total) by mouth every 8 (eight) hours as needed for up to 5 days for nausea (or headache). 11/27/17 12/02/17  Arta Silence, MD  omeprazole (PRILOSEC OTC) 20 MG tablet Take 20 mg by mouth daily.    [provider]  oxyCODONE-acetaminophen (PERCOCET) 5-325 MG tablet Take 1 tablet by mouth every 6 (six) hours as needed for severe pain. 04/19/18 04/19/19  Johnn Hai, PA-C  oxyCODONE-acetaminophen (PERCOCET) 5-325 MG tablet Take 1 tablet by mouth 2 (two) times daily for 1 day. 04/19/18 04/20/18  Menshew, Dannielle Karvonen, PA-C  ranitidine (ZANTAC) 150 MG tablet Take 150 mg by mouth 2 (two) times daily as needed for heartburn.    [provider]     Allergies Patient has no known allergies.  No family history on file.  Social History Social History   Tobacco Use  . Smoking status: Current Every Day Smoker    Packs/day: 1.00    Types: Cigarettes  . Smokeless tobacco: Never Used  Substance Use Topics  . Alcohol use: Not Currently  . Drug use: Not Currently    Review of Systems Constitutional: No fever/chills Eyes: No visual changes. ENT: No sore throat. Cardiovascular: Denies chest pain. Respiratory: Denies shortness of breath. Gastrointestinal: No abdominal pain.  No nausea, no vomiting.  No diarrhea.  No constipation. Genitourinary: Positive for possible abscess labia. Musculoskeletal: Negative for back pain. Skin: Negative for rash. Neurological: Negative for headaches, focal weakness or numbness.  ____________________________________________   PHYSICAL EXAM:  VITAL SIGNS: ED Triage Vitals  Enc Vitals Group     BP 04/19/18 1555 (!) 162/99     Pulse Rate 04/19/18 1555 74     Resp 04/19/18 1555 18     Temp 04/19/18 1555 98.2 F (36.8 C)     Temp Source 04/19/18 1555 Oral     SpO2 04/19/18 1555 100 %     Weight 04/19/18 1553 212 lb (96.2 kg)     Height 04/19/18 1553 5\' 9"  (1.753 m)     Head Circumference --      Peak Flow --      Pain Score 04/19/18 1553 10  Pain Loc --      Pain Edu? --      Excl. in [place]? --    Constitutional: Alert and oriented. Well appearing and in no acute distress. Eyes: Conjunctivae are normal. PERRL. EOMI. Head: Atraumatic. Neck: No stridor.   Cardiovascular: Normal rate, regular rhythm. Grossly normal heart sounds.  Good peripheral circulation. Respiratory: Normal respiratory effort.  No retractions. Lungs CTAB. Gastrointestinal: Soft and nontender. No distention.  Genitourinary: On examination there is a soft extremely tender cystic formation noted to the left inner labia which appears to extend into the vaginal area.  No vaginal discharge is noted. Musculoskeletal:  No lower extremity tenderness nor edema.   Neurologic:  Normal speech and language. No gross focal neurologic deficits are appreciated. No gait instability. Skin:  Skin is warm, dry and intact. No rash noted. Psychiatric: Mood and affect are normal. Speech and behavior are normal.  ____________________________________________   LABS (all labs ordered are listed, but only abnormal results are displayed)  Labs Reviewed  CBC WITH DIFFERENTIAL/PLATELET - Abnormal; Notable for the following components:      Result Value   Hemoglobin 9.1 (*)    HCT 31.2 (*)    MCV 77.6 (*)    MCH 22.6 (*)    MCHC 29.2 (*)    RDW 19.2 (*)    Platelets 460 (*)    All other components within normal limits  COMPREHENSIVE METABOLIC PANEL - Abnormal; Notable for the following components:   Glucose, Bld 69 (*)    Creatinine, Ser 2.25 (*)    Calcium 8.8 (*)    Albumin 3.2 (*)    GFR calc non Af Amer 26 (*)    GFR calc Af Amer 30 (*)    All other components within normal limits   RADIOLOGY   Official radiology report(s): No results found. Ultrasound per radiologist shows a left labial abscess. ____________________________________________   PROCEDURES  Procedure(s) performed: None  Procedures  Critical Care performed: No  ____________________________________________   INITIAL IMPRESSION / ASSESSMENT AND PLAN / ED COURSE  As part of my medical decision making, I reviewed the following data within the electronic MEDICAL RECORD NUMBER Notes from prior ED visits and [place] Controlled Substance Database   Patient presents to the ED with questionable abscess to her left labial area.  Patient states that she has not had any injury to the area and area appears to be getting larger and more painful while she worked today.  Ultrasound was obtained and confirmed labial abscess.  Patient was given fentanyl 50 mg IV for pain control and clindamycin 600 mg.  Patient did get relief.  Patient care was transferred to  Loney Loh, PA-C for I&D of the abscess.  Medications was sent to patient's pharmacy.   ____________________________________________   FINAL CLINICAL IMPRESSION(S) / ED DIAGNOSES  Final diagnoses:  Labial abscess     ED Discharge Orders         Ordered    clindamycin (CLEOCIN) 150 MG capsule     04/19/18 1954    oxyCODONE-acetaminophen (PERCOCET) 5-325 MG tablet  Every 6 hours PRN     04/19/18 1954    oxyCODONE-acetaminophen (PERCOCET) 5-325 MG tablet  2 times daily     04/19/18 2106           Note:  This document was prepared using Dragon voice recognition software and may include unintentional dictation errors.    Johnn Hai, PA-C 04/20/18 1916    Harvest Dark, MD  04/20/18 2028  

## 2018-04-19 NOTE — ED Triage Notes (Signed)
Pt comes via POV from home with c/o possible cyst. Pt states she noticed it about 3 days ago. Pt states it is located on the left inner labia side.  Pt denies any odor or drainage. Pt states pulsating pain.

## 2018-04-19 NOTE — Discharge Instructions (Signed)
Return to the emergency department in 2 days to have packing removed. Begin taking antibiotics as directed until completely finished.  Pain medication is to be taken every 6 hours as needed for pain.  Do not drive or operate machinery while taking this pain medication as it could cause drowsiness.  Return to the emergency department sooner if any worsening of your symptoms or fever, chills or inability to take the antibiotic.

## 2018-04-20 NOTE — ED Provider Notes (Signed)
[facility] Emergency Department Provider Note ____________________________________________  Time seen: 1645  I have reviewed the triage vital signs and the nursing notes.  HISTORY  Chief Complaint  possible cyst  HPI Gina Doyle is a 43 y.o. female with a left labial abscess. See the previous note for primary HPI.   I am assuming care from [facility] [place]. Patient IV pain medicine has been delayed. I will await medicine administration and perform I&D procedure.   Past Medical History:  Diagnosis Date  . Hypertension     There are no active problems to display for this patient.  History reviewed. No pertinent surgical history. ____________________________________________  PHYSICAL EXAM:  Constitutional: Alert and oriented. Well appearing and in no distress. Cardiovascular: Normal rate, regular rhythm. Normal distal pulses. Respiratory: Normal respiratory effort.  GU: Normal external genitalia. Left labia minora with a focal cystic formation concerning for a bartholin's gland abscess.  ____________________________________________  PROCEDURES  .Marland KitchenIncision and Drainage Date/Time: 04/20/2018 12:18 AM Performed by: Melvenia Needles, PA-C Authorized by: Melvenia Needles, PA-C   Consent:    Consent obtained:  Verbal   Consent given by:  Patient   Risks discussed:  Bleeding and pain   Alternatives discussed:  Referral Location:    Type:  Bartholin cyst Pre-procedure details:    Skin preparation:  Betadine Anesthesia (see MAR for exact dosages):    Anesthesia method:  Local infiltration and topical application   Topical anesthesia: benzocaine spray.   Local anesthetic:  Lidocaine 1% w/o epi Procedure type:    Complexity:  Simple Procedure details:    Needle aspiration: no     Incision types:  Stab incision   Incision depth:  Submucosal   Scalpel blade:  11   Wound management:  Probed and deloculated and irrigated with  saline   Drainage:  Purulent   Drainage amount:  Copious   Packing materials:  1/4 in iodoform gauze   Amount 1/4" iodoform:  2 cm Post-procedure details:    Patient tolerance of procedure:  Tolerated well, no immediate complications  ____________________________________________  INITIAL IMPRESSION / ASSESSMENT AND PLAN / ED COURSE  Patient with ED evaluation management of a left labial abscess concerning for Bartholin's gland abscess.  The abscess was drained and packed with a small iodoform wick.  Patient is given wound care instruction is discharged with a printed prescription for 2 Percocet pills as the electronic prescription has been sent to her pharmacy which is closed at this time.  Patient will follow-up as discussed, and you may remove the packing in 2 days. ____________________________________________  FINAL CLINICAL IMPRESSION(S) / ED DIAGNOSES  Final diagnoses:  Labial abscess      Melvenia Needles, PA-C 04/20/18 0022    Nena Polio, MD 04/25/18 [phone]

## 2018-07-09 ENCOUNTER — Emergency Department
Admission: EM | Admit: 2018-07-09 | Discharge: 2018-07-09 | Disposition: A | Discharge status: Home / Self Care | Payer: Medicaid Other | Attending: Emergency Medicine | Admitting: Emergency Medicine

## 2018-07-09 ENCOUNTER — Other Ambulatory Visit: Payer: Self-pay

## 2018-07-09 ENCOUNTER — Encounter: Payer: Self-pay | Admitting: Emergency Medicine

## 2018-07-09 DIAGNOSIS — Z79899 Other long term (current) drug therapy: Secondary | ICD-10-CM | POA: Insufficient documentation

## 2018-07-09 DIAGNOSIS — F17210 Nicotine dependence, cigarettes, uncomplicated: Secondary | ICD-10-CM | POA: Insufficient documentation

## 2018-07-09 DIAGNOSIS — N764 Abscess of vulva: Secondary | ICD-10-CM | POA: Insufficient documentation

## 2018-07-09 MED ORDER — METRONIDAZOLE 500 MG PO TABS: 500.0000 mg | ORAL_TABLET | Freq: Two times a day (BID) | ORAL | 0 refills | Status: DC

## 2018-07-09 MED ORDER — HYDROMORPHONE HCL 1 MG/ML IJ SOLN
1.0000 mg | Freq: Once | INTRAMUSCULAR | Status: AC
  Administered 2018-07-09: 11:00:00 1 mg via INTRAVENOUS
  Filled 2018-07-09: qty 1

## 2018-07-09 MED ORDER — SODIUM CHLORIDE 0.9 % IV SOLN
1.0000 g | Freq: Once | INTRAVENOUS | Status: AC
  Administered 2018-07-09: 12:00:00 1 g via INTRAVENOUS
  Filled 2018-07-09: qty 10

## 2018-07-09 MED ORDER — LIDOCAINE HCL (PF) 1 % IJ SOLN
5.0000 mL | Freq: Once | INTRAMUSCULAR | Status: DC
  Filled 2018-07-09: qty 5

## 2018-07-09 MED ORDER — OXYCODONE-ACETAMINOPHEN 5-325 MG PO TABS: 1.0000 | ORAL_TABLET | ORAL | 0 refills | Status: DC | PRN

## 2018-07-09 MED ORDER — SULFAMETHOXAZOLE-TRIMETHOPRIM 800-160 MG PO TABS: 1.0000 | ORAL_TABLET | Freq: Two times a day (BID) | ORAL | 0 refills | Status: DC

## 2018-07-09 MED ORDER — ONDANSETRON HCL 4 MG/2ML IJ SOLN
4.0000 mg | Freq: Once | INTRAMUSCULAR | Status: AC
  Administered 2018-07-09: 4 mg via INTRAVENOUS
  Filled 2018-07-09: qty 2

## 2018-07-09 NOTE — Discharge Instructions (Signed)
Follow-up with a GYN doctor.  You need to be evaluated as this is the second occurrence of a Bartholin cyst.  If it recurs again they may need to take you to the operating room to do a deeper incision and drainage.  Take your medications as prescribed.  Return if worsening.  You may remove the packing in 2 days.

## 2018-07-09 NOTE — ED Notes (Signed)
Patient AAOx4. Vitals stable. NAD. 

## 2018-07-09 NOTE — ED Provider Notes (Signed)
[facility] Emergency Department Provider Note  ____________________________________________   First MD Initiated Contact with Patient 07/09/18 1042     (approximate)  I have reviewed the triage vital signs and the nursing notes.   HISTORY  Chief Complaint Abscess    HPI Gina Doyle is a 43 y.o. female presents emergency department with complaints of a abscess to the labia.  Patient has a history of the same.  She had an I&D of this area approximately 2 months ago.  She denies any fever or chills.  Symptoms started 2 days ago but the swelling started yesterday.  States she had a tingling sensation at first.    Past Medical History:  Diagnosis Date   Hypertension     There are no active problems to display for this patient.   History reviewed. No pertinent surgical history.  Prior to Admission medications   Medication Sig Start Date End Date Taking? Authorizing Provider  atenolol (TENORMIN) 50 MG tablet Take 1 tablet (50 mg total) by mouth daily. 07/26/17 07/26/18  Hinda Kehr, MD  atenolol (TENORMIN) 50 MG tablet Take 50 mg by mouth daily.    [provider]  metoCLOPramide (REGLAN) 10 MG tablet Take 1 tablet (10 mg total) by mouth every 8 (eight) hours as needed for up to 5 days for nausea (or headache). 11/27/17 12/02/17  Arta Silence, MD  metroNIDAZOLE (FLAGYL) 500 MG tablet Take 1 tablet (500 mg total) by mouth 2 (two) times daily. 07/09/18   Tayvian Holycross, Linden Dolin, PA-C  omeprazole (PRILOSEC OTC) 20 MG tablet Take 20 mg by mouth daily.    [provider]  oxyCODONE-acetaminophen (PERCOCET/ROXICET) 5-325 MG tablet Take 1 tablet by mouth every 4 (four) hours as needed for severe pain. 07/09/18   Eugine Bubb, Linden Dolin, PA-C  ranitidine (ZANTAC) 150 MG tablet Take 150 mg by mouth 2 (two) times daily as needed for heartburn.    [provider]  sulfamethoxazole-trimethoprim (BACTRIM DS) 800-160 MG tablet Take 1 tablet by mouth 2  (two) times daily. 07/09/18   Versie Starks, PA-C    Allergies Patient has no known allergies.  History reviewed. No pertinent family history.  Social History Social History   Tobacco Use   Smoking status: Current Every Day Smoker    Packs/day: 1.00    Types: Cigarettes   Smokeless tobacco: Never Used  Substance Use Topics   Alcohol use: Not Currently   Drug use: Not Currently    Review of Systems  Constitutional: No fever/chills Eyes: No visual changes. ENT: No sore throat. Respiratory: Denies cough Genitourinary: Negative for dysuria.  Positive for labial abscess Musculoskeletal: Negative for back pain. Skin: Negative for rash.    ____________________________________________   PHYSICAL EXAM:  VITAL SIGNS: ED Triage Vitals  Enc Vitals Group     BP 07/09/18 1022 (!) 166/106     Pulse Rate 07/09/18 1022 94     Resp 07/09/18 1022 18     Temp 07/09/18 1022 98.1 F (36.7 C)     Temp Source 07/09/18 1022 Oral     SpO2 07/09/18 1022 100 %     Weight --      Height --      Head Circumference --      Peak Flow --      Pain Score 07/09/18 1024 8     Pain Loc --      Pain Edu? --      Excl. in [place]? --  Constitutional: Alert and oriented. Well appearing and in no acute distress. Eyes: Conjunctivae are normal.  Head: Atraumatic. Nose: No congestion/rhinnorhea. Mouth/Throat: Mucous membranes are moist.   Neck:  supple no lymphadenopathy noted Cardiovascular: Normal rate, regular rhythm. Heart sounds are normal Respiratory: Normal respiratory effort.  No retractions, lungs c t a  GU: Swelling of the left labia along with tenderness.  Hard area extends into the inner wall of the labia Musculoskeletal: FROM all extremities, warm and well perfused Neurologic:  Normal speech and language.  Skin:  Skin is warm, dry and intact. No rash noted. Psychiatric: Mood and affect are normal. Speech and behavior are  normal.  ____________________________________________   LABS (all labs ordered are listed, but only abnormal results are displayed)  Labs Reviewed - No data to display ____________________________________________   ____________________________________________  RADIOLOGY    ____________________________________________   PROCEDURES  Procedure(s) performed: Saline lock, Dilaudid 1 mg IV, Zofran 4 mg IV, Rocephin 1 g IV  .Marland KitchenIncision and Drainage Date/Time: 07/09/2018 2:00 PM Performed by: Versie Starks, PA-C Authorized by: Versie Starks, PA-C   Consent:    Consent obtained:  Verbal   Consent given by:  Patient   Risks discussed:  Incomplete drainage, pain, bleeding and infection   Alternatives discussed:  Delayed treatment and referral Location:    Type:  Bartholin cyst   Location:  Anogenital   Anogenital location:  Bartholin's gland Pre-procedure details:    Skin preparation:  Betadine Anesthesia (see MAR for exact dosages):    Anesthesia method:  Local infiltration   Local anesthetic:  Lidocaine 1% WITH epi Procedure type:    Complexity:  Simple Procedure details:    Incision types:  Stab incision   Incision depth:  Submucosal   Scalpel blade:  11   Wound management:  Probed and deloculated and irrigated with saline   Drainage:  Purulent and bloody   Drainage amount:  Copious   Wound treatment:  Drain placed   Packing materials:  1/4 in iodoform gauze Post-procedure details:    Patient tolerance of procedure:  Tolerated well, no immediate complications      ____________________________________________   INITIAL IMPRESSION / ASSESSMENT AND PLAN / ED COURSE  Pertinent labs & imaging results that were available during my care of the patient were reviewed by me and considered in my medical decision making (see chart for details).   Patient is 43 year old female presents emergency department with recurrent abscess of the vaginal wall/Bartholin's gland.   History of the same when she lived in [place].  She states it took multiple antibiotics to finally prevent the Bartholin cyst.  No fever or chills.  Physical exam shows a large tender area noted along the labia that extends into the entrance of the vagina.  Area is typical of a Bartholin cyst.  See procedure note for I&D. The patient had been given Dilaudid 1 mg IV and Zofran 4 mg IV prior to the procedure.  Patient tolerated procedure well.  Due to the copious amount of infection along with the odor Rocephin 1 g IV was given.  She will be discharged on Septra and Flagyl due to recurrent BV.  She is to follow-up with her regular OB/GYN or Dr. Leonides Schanz.  She was given phone number to make an appointment.  Packing should be removed in 2 days.  She states she understands and will comply.  She was also given a prescription for Percocet for pain.  And a work note.  She was  discharged in stable condition.     As part of my medical decision making, I reviewed the following data within the [place] notes reviewed and incorporated, Old chart reviewed, Notes from prior ED visits and [place] Controlled Substance Database  ____________________________________________   FINAL CLINICAL IMPRESSION(S) / ED DIAGNOSES  Final diagnoses:  Abscess of genital labia      NEW MEDICATIONS STARTED DURING THIS VISIT:  Discharge Medication List as of 07/09/2018  1:02 PM    START taking these medications   Details  metroNIDAZOLE (FLAGYL) 500 MG tablet Take 1 tablet (500 mg total) by mouth 2 (two) times daily., Starting Sun 07/09/2018, Normal    sulfamethoxazole-trimethoprim (BACTRIM DS) 800-160 MG tablet Take 1 tablet by mouth 2 (two) times daily., Starting Sun 07/09/2018, Normal         Note:  This document was prepared using Dragon voice recognition software and may include unintentional dictation errors.    Versie Starks, PA-C 07/09/18 [place], [place], MD 07/09/18  [phone]

## 2018-07-09 NOTE — ED Triage Notes (Signed)
Pt states vaginal abscess, pt states hx of same. Pt states pain x 2 days.

## 2018-08-19 ENCOUNTER — Other Ambulatory Visit: Payer: Self-pay

## 2018-08-19 ENCOUNTER — Emergency Department: Payer: Self-pay

## 2018-08-19 ENCOUNTER — Emergency Department
Admission: EM | Admit: 2018-08-19 | Discharge: 2018-08-19 | Disposition: A | Discharge status: Home / Self Care | Payer: Self-pay | Attending: Emergency Medicine | Admitting: Emergency Medicine

## 2018-08-19 DIAGNOSIS — I10 Essential (primary) hypertension: Secondary | ICD-10-CM

## 2018-08-19 DIAGNOSIS — I129 Hypertensive chronic kidney disease with stage 1 through stage 4 chronic kidney disease, or unspecified chronic kidney disease: Secondary | ICD-10-CM | POA: Insufficient documentation

## 2018-08-19 DIAGNOSIS — R590 Localized enlarged lymph nodes: Secondary | ICD-10-CM

## 2018-08-19 DIAGNOSIS — N183 Chronic kidney disease, stage 3 unspecified: Secondary | ICD-10-CM

## 2018-08-19 DIAGNOSIS — F17210 Nicotine dependence, cigarettes, uncomplicated: Secondary | ICD-10-CM | POA: Insufficient documentation

## 2018-08-19 DIAGNOSIS — Z79899 Other long term (current) drug therapy: Secondary | ICD-10-CM | POA: Insufficient documentation

## 2018-08-19 LAB — COMPREHENSIVE METABOLIC PANEL
ALT: 15 U/L (ref 0–44)
AST: 21 U/L (ref 15–41)
Albumin: 3.4 g/dL — ABNORMAL LOW (ref 3.5–5.0)
Alkaline Phosphatase: 96 U/L (ref 38–126)
Anion gap: 7 (ref 5–15)
BUN: 21 mg/dL — ABNORMAL HIGH (ref 6–20)
CO2: 18 mmol/L — ABNORMAL LOW (ref 22–32)
Calcium: 9.3 mg/dL (ref 8.9–10.3)
Chloride: 113 mmol/L — ABNORMAL HIGH (ref 98–111)
Creatinine, Ser: 2.21 mg/dL — ABNORMAL HIGH (ref 0.44–1.00)
GFR calc Af Amer: 31 mL/min — ABNORMAL LOW (ref >60)
GFR calc non Af Amer: 26 mL/min — ABNORMAL LOW (ref >60)
Glucose, Bld: 95 mg/dL (ref 70–99)
Potassium: 4.8 mmol/L (ref 3.5–5.1)
Sodium: 138 mmol/L (ref 135–145)
Total Bilirubin: 0.5 mg/dL (ref 0.3–1.2)
Total Protein: 8.2 g/dL — ABNORMAL HIGH (ref 6.5–8.1)

## 2018-08-19 LAB — CBC WITH DIFFERENTIAL/PLATELET
Abs Immature Granulocytes: 0.03 10*3/uL (ref 0.00–0.07)
Basophils Absolute: 0 10*3/uL (ref 0.0–0.1)
Basophils Relative: 1 %
Eosinophils Absolute: 0.2 10*3/uL (ref 0.0–0.5)
Eosinophils Relative: 4 %
HCT: 32.5 % — ABNORMAL LOW (ref 36.0–46.0)
Hemoglobin: 9.3 g/dL — ABNORMAL LOW (ref 12.0–15.0)
Immature Granulocytes: 1 %
Lymphocytes Relative: 25 %
Lymphs Abs: 1.6 10*3/uL (ref 0.7–4.0)
MCH: 21.9 pg — ABNORMAL LOW (ref 26.0–34.0)
MCHC: 28.6 g/dL — ABNORMAL LOW (ref 30.0–36.0)
MCV: 76.5 fL — ABNORMAL LOW (ref 80.0–100.0)
Monocytes Absolute: 0.5 10*3/uL (ref 0.1–1.0)
Monocytes Relative: 7 %
Neutro Abs: 4 10*3/uL (ref 1.7–7.7)
Neutrophils Relative %: 62 %
Platelets: 395 10*3/uL (ref 150–400)
RBC: 4.25 MIL/uL (ref 3.87–5.11)
RDW: 20.2 % — ABNORMAL HIGH (ref 11.5–15.5)
WBC: 6.4 10*3/uL (ref 4.0–10.5)
nRBC: 0 % (ref 0.0–0.2)

## 2018-08-19 MED ORDER — PREDNISONE 10 MG (21) PO TBPK: ORAL_TABLET | ORAL | 0 refills | Status: DC

## 2018-08-19 MED ORDER — OMEPRAZOLE 40 MG PO CPDR: 40.0000 mg | DELAYED_RELEASE_CAPSULE | Freq: Every day | ORAL | 0 refills | Status: DC

## 2018-08-19 NOTE — ED Provider Notes (Signed)
[facility] EMERGENCY DEPARTMENT Provider Note   CSN: [id number] Arrival date & time: 08/19/18  1027    History   Chief Complaint Chief Complaint  Patient presents with   Neck Pain    HPI Gina Doyle is a 43 y.o. female.     Patient presents to the emergency department today due to multiple medical complaints.  She states that for approximately a month, she has been unintentionally losing weight.  She believes that she has lost approximately 15 pounds during this time.  For the past 3 weeks, she has had pain in the left side of her neck just behind her left ear that extends down to the left clavicle.  She reports that since this started, she has felt as if she could not completely swallow her food.  She reports she "smokes like a chimney."  She is concerned that she may have cancer.  For the pain in the left side of her neck she has taken Tylenol without relief.     Past Medical History:  Diagnosis Date   Hypertension     There are no active problems to display for this patient.   History reviewed. No pertinent surgical history.   OB History   No obstetric history on file.      Home Medications    Prior to Admission medications   Medication Sig Start Date End Date Taking? Authorizing Provider  atenolol (TENORMIN) 50 MG tablet Take 1 tablet (50 mg total) by mouth daily. 07/26/17 07/26/18  Hinda Kehr, MD  atenolol (TENORMIN) 50 MG tablet Take 50 mg by mouth daily.    [provider]  metoCLOPramide (REGLAN) 10 MG tablet Take 1 tablet (10 mg total) by mouth every 8 (eight) hours as needed for up to 5 days for nausea (or headache). 11/27/17 12/02/17  Arta Silence, MD  omeprazole (PRILOSEC OTC) 20 MG tablet Take 20 mg by mouth daily.    [provider]  omeprazole (PRILOSEC) 40 MG capsule Take 1 capsule (40 mg total) by mouth daily. 08/19/18 08/19/19  Kalilah Barua, Dessa Phi, FNP  predniSONE (STERAPRED UNI-PAK 21 TAB) 10 MG (21) TBPK  tablet Take 6 tablets on the first day and decrease by 1 tablet each day until finished. 08/19/18   Deedee Lybarger, Dessa Phi, FNP    Family History No family history on file.  Social History Social History   Tobacco Use   Smoking status: Current Every Day Smoker    Packs/day: 1.00    Types: Cigarettes   Smokeless tobacco: Never Used  Substance Use Topics   Alcohol use: Not Currently   Drug use: Not Currently     Allergies   Patient has no known allergies.   Review of Systems Review of Systems  Constitutional: Positive for appetite change, fatigue and unexpected weight change. Negative for activity change, chills and fever.  HENT: Positive for mouth sores and trouble swallowing. Negative for congestion, ear pain, hearing loss, sinus pain, sneezing, sore throat, tinnitus and voice change.   Eyes: Negative for photophobia, redness and visual disturbance.  Respiratory: Negative for cough, chest tightness, shortness of breath, wheezing and stridor.   Cardiovascular: Negative for chest pain, palpitations and leg swelling.  Gastrointestinal: Negative for abdominal pain, diarrhea, nausea and vomiting.  Endocrine: Negative for polydipsia, polyphagia and polyuria.  Genitourinary: Negative for dysuria.  Musculoskeletal: Positive for neck pain. Negative for neck stiffness.  Skin: Positive for rash. Negative for color change and wound.  Neurological: Negative for dizziness,  syncope, speech difficulty, weakness, numbness and headaches.  Psychiatric/Behavioral: Negative for confusion. The patient is nervous/anxious.      Physical Exam Updated Vital Signs BP (!) 148/104 (BP Location: Right Arm)    Pulse 84    Temp 98.5 F (36.9 C) (Oral)    Resp 16    Ht 5\' 10"  (1.778 m)    Wt 88.5 kg    LMP 06/14/2018 (Approximate)    SpO2 100%    BMI 27.98 kg/m   Physical Exam Constitutional:      General: She is not in acute distress.    Appearance: She is not ill-appearing or diaphoretic.  HENT:      Head: Normocephalic.     Right Ear: Tympanic membrane normal.     Left Ear: Tympanic membrane normal.     Nose: Nose normal.     Mouth/Throat:     Mouth: Mucous membranes are moist.  Eyes:     Extraocular Movements: Extraocular movements intact.     Pupils: Pupils are equal, round, and reactive to light.  Neck:     Musculoskeletal: Normal range of motion. Muscular tenderness present. No neck rigidity.     Vascular: No carotid bruit.  Cardiovascular:     Rate and Rhythm: Normal rate and regular rhythm.  Pulmonary:     Effort: Pulmonary effort is normal.     Breath sounds: Normal breath sounds.  Abdominal:     General: Abdomen is flat.  Musculoskeletal:        General: No swelling, tenderness or deformity.  Lymphadenopathy:     Cervical: Cervical adenopathy present.  Skin:    General: Skin is warm and dry.  Neurological:     Mental Status: She is alert and oriented to person, place, and time.  Psychiatric:        Mood and Affect: Mood normal.        Behavior: Behavior normal.        Thought Content: Thought content normal.        Judgment: Judgment normal.      ED Treatments / Results  Labs (all labs ordered are listed, but only abnormal results are displayed) Labs Reviewed  CBC WITH DIFFERENTIAL/PLATELET - Abnormal; Notable for the following components:      Result Value   Hemoglobin 9.3 (*)    HCT 32.5 (*)    MCV 76.5 (*)    MCH 21.9 (*)    MCHC 28.6 (*)    RDW 20.2 (*)    All other components within normal limits  COMPREHENSIVE METABOLIC PANEL - Abnormal; Notable for the following components:   Chloride 113 (*)    CO2 18 (*)    BUN 21 (*)    Creatinine, Ser 2.21 (*)    Total Protein 8.2 (*)    Albumin 3.4 (*)    GFR calc non Af Amer 26 (*)    GFR calc Af Amer 31 (*)    All other components within normal limits    EKG None  Radiology Ct Soft Tissue Neck Wo Contrast  Result Date: 08/19/2018 CLINICAL DATA:  Increased neck pain over the past few days.  Choking sensation. EXAM: CT NECK WITHOUT CONTRAST TECHNIQUE: Multidetector CT imaging of the neck was performed following the standard protocol without intravenous contrast. COMPARISON:  None. FINDINGS: The study was performed without contrast. There is suboptimal evaluation for inflammatory processes in the neck. Pharynx and larynx: Mild nasopharyngeal adenoidal prominence. Mild pharyngeal palatine tonsillar adenoidal prominence, greater on  the LEFT. No epiglottic enlargement. Mild-to-moderate lingual tonsillar prominence, projecting into the valleculae. The glottic region appears edematous. No discrete true and false vocal cords are seen. Correlate clinically for stridor. Subglottic region appears normal. Salivary glands: No inflammation, mass, or stone. Thyroid: Normal. Lymph nodes: BILATERAL lymph nodes are mildly prominent, but none appear greater than 1 cm short axis to suggest significant pathology. These nodes are likely reactive. As best can be visualized on noncontrast exam, there is relatively symmetric involvement of nodes in stations I, II, III, IV, and V. Vascular: Cannot be assessed on noncontrast exam. Limited intracranial: Not visualized. Visualized orbits: Partially visualized, no abnormality. Mastoids and visualized paranasal sinuses: No layering fluid. Skeleton: Cervical spondylosis, most prominent at C6-C7 with disc space narrowing and osseous spurring. Upper chest: Degraded by respiratory motion. No mass or pneumothorax. Other: None. IMPRESSION: Noncontrast examination.  See discussion above. Prominence of the nasopharyngeal adenoids and palatine/lingual tonsils. Edematous glottis, without discrete visualization of the false or true cords. Correlate clinically for stridor. Subglottic region appears normal. BILATERAL lymph nodes are mildly prominent, none greater than 1 cm, and could reflect reactive changes from a pharyngeal inflammatory insult. Electronically Signed   By: Staci Righter M.D.    On: 08/19/2018 13:01    Procedures Procedures (including critical care time)  Medications Ordered in ED Medications - No data to display   Initial Impression / Assessment and Plan / ED Course  I have reviewed the triage vital signs and the nursing notes.  Pertinent labs & imaging results that were available during my care of the patient were reviewed by me and considered in my medical decision making (see chart for details).        43 year old female presenting to the emergency department for treatment and evaluation of neck pain that has been ongoing for the past 3 weeks and concern for cancer due to heavy smoking.  Although, based on exam, feel that the pain is musculoskeletal she reports a history that is of some concern.  Labs and CT of the neck to be completed.  Patient states that she would feel reassured if these are all at baseline for her.  Of note, she is hypertensive and states that she is "always hypertensive."  She takes her medications at night and did take it last night.  She states that her blood pressure reading is not surprising for her.  Overall, patient's evaluation and imaging is reassuring.  There is some adenopathy that would explain the patient's symptoms.  That will be treated with prednisone, but she was advised to be cognizant of her blood pressure and to check it daily.  If it is elevated above what she is comfortable with or normally sees and she is having any symptoms of concern, she is to stop taking prednisone and return to the emergency department.  In addition, we had a serious discussion about the amount that she is smoking and how it will contribute to her hypertension and decreasing kidney function.  Although kidney function is at 26 today, this appears to be normal for her.  She states that when she lived in [place], she was under the care of a nephrologist but since moving here she has not found a free clinic and does not have any insurance.  A list of  community resources will be provided to her upon discharge and she states that she will call Monday to see if she can schedule an appointment with 1 of the free clinics.  Patient  was also encouraged to exercise and reduce salt intake.  She will be discharged home with the above referrals and is advised to return to the emergency department for any symptom of concern.  Final Clinical Impressions(s) / ED Diagnoses   Final diagnoses:  Hypertension, unspecified type  Stage 3 chronic kidney disease ([place])  Cervical adenopathy    ED Discharge Orders         Ordered    predniSONE (STERAPRED UNI-PAK 21 TAB) 10 MG (21) TBPK tablet     08/19/18 1333    omeprazole (PRILOSEC) 40 MG capsule  Daily     08/19/18 1333           Victorino Dike, FNP 08/19/18 1527    Schuyler Amor, MD 08/20/18 [phone]

## 2018-08-19 NOTE — ED Triage Notes (Signed)
Pt c/o neck pain for the past couple of weeks, denies injury

## 2018-08-19 NOTE — Discharge Instructions (Signed)
Please call and request an appointment with the primary care provider of your choice.   Check your blood pressure daily while on prednisone as it may increase your blood pressure.  Continue exercising. STOP smoking.  Return to the ER for symptoms that change or worsen if unable to see primary care.

## 2019-01-10 ENCOUNTER — Other Ambulatory Visit: Payer: Self-pay

## 2019-01-10 ENCOUNTER — Emergency Department
Admission: EM | Admit: 2019-01-10 | Discharge: 2019-01-10 | Disposition: A | Discharge status: Home / Self Care | Payer: Medicaid Other | Attending: Emergency Medicine | Admitting: Emergency Medicine

## 2019-01-10 DIAGNOSIS — N183 Chronic kidney disease, stage 3 unspecified: Secondary | ICD-10-CM | POA: Insufficient documentation

## 2019-01-10 DIAGNOSIS — I129 Hypertensive chronic kidney disease with stage 1 through stage 4 chronic kidney disease, or unspecified chronic kidney disease: Secondary | ICD-10-CM | POA: Insufficient documentation

## 2019-01-10 DIAGNOSIS — F17210 Nicotine dependence, cigarettes, uncomplicated: Secondary | ICD-10-CM | POA: Insufficient documentation

## 2019-01-10 DIAGNOSIS — Z79899 Other long term (current) drug therapy: Secondary | ICD-10-CM | POA: Insufficient documentation

## 2019-01-10 DIAGNOSIS — I10 Essential (primary) hypertension: Secondary | ICD-10-CM

## 2019-01-10 DIAGNOSIS — R519 Headache, unspecified: Secondary | ICD-10-CM | POA: Insufficient documentation

## 2019-01-10 MED ORDER — PANTOPRAZOLE SODIUM 40 MG PO TBEC
40.0000 mg | DELAYED_RELEASE_TABLET | Freq: Every day | ORAL | Status: DC
  Administered 2019-01-10: 40 mg via ORAL
  Filled 2019-01-10: qty 1

## 2019-01-10 MED ORDER — ACETAMINOPHEN 325 MG PO TABS
650.0000 mg | ORAL_TABLET | Freq: Once | ORAL | Status: AC
  Administered 2019-01-10: 650 mg via ORAL
  Filled 2019-01-10: qty 2

## 2019-01-10 MED ORDER — OMEPRAZOLE 40 MG PO CPDR: 40.0000 mg | DELAYED_RELEASE_CAPSULE | Freq: Every day | ORAL | 2 refills | Status: DC

## 2019-01-10 MED ORDER — ATENOLOL 50 MG PO TABS: 50.0000 mg | ORAL_TABLET | Freq: Every day | ORAL | 2 refills | Status: DC

## 2019-01-10 MED ORDER — ATENOLOL 25 MG PO TABS
50.0000 mg | ORAL_TABLET | Freq: Once | ORAL | Status: AC
  Administered 2019-01-10: 50 mg via ORAL
  Filled 2019-01-10: qty 2

## 2019-01-10 NOTE — ED Provider Notes (Signed)
[facility] Emergency Department Provider Note ____________________________________________  Time seen: 1224  I have reviewed the triage vital signs and the nursing notes.  HISTORY  Chief Complaint  Headache  HPI Gina Doyle is a 43 y.o. female presents itself to the ED for evaluation of a 2 to 3-day complaint of intermittent headache that she describes as throbbing pain across the bilateral temples and forehead.  Patient also reports she has been previously treated for hypertension, and ran out of the ED prescribed antihypertensive, about 2 weeks ago.  She is not had a chance to establish care with a PCP at this time.  She denies any nausea, vomiting, or dizziness.  She also denies any chest pain, shortness of breath, or weakness.  The patient has had no significant sick contacts or recent exposures to Covid.  She does report that she has been taking pantoprazole for heartburn reflux, and has had some significant pain and burning to the throat.  Otherwise the patient denies any visual disturbance, weakness, or paresthesias.   Past Medical History:  Diagnosis Date  . Hypertension     There are no active problems to display for this patient.   History reviewed. No pertinent surgical history.  Prior to Admission medications   Medication Sig Start Date End Date Taking? Authorizing Provider  atenolol (TENORMIN) 50 MG tablet Take 50 mg by mouth daily.    [provider]  atenolol (TENORMIN) 50 MG tablet Take 1 tablet (50 mg total) by mouth daily. 01/10/19 04/10/19  Janiqua Friscia, Dannielle Karvonen, PA-C  omeprazole (PRILOSEC) 40 MG capsule Take 1 capsule (40 mg total) by mouth daily. 01/10/19 04/10/19  Tamaria Dunleavy, Dannielle Karvonen, PA-C  metoCLOPramide (REGLAN) 10 MG tablet Take 1 tablet (10 mg total) by mouth every 8 (eight) hours as needed for up to 5 days for nausea (or headache). 11/27/17 01/10/19  Arta Silence, MD  omeprazole (PRILOSEC OTC) 20 MG tablet Take 20  mg by mouth daily.  01/10/19  [provider]    Allergies Patient has no known allergies.  No family history on file.  Social History Social History   Tobacco Use  . Smoking status: Current Every Day Smoker    Packs/day: 1.00    Types: Cigarettes  . Smokeless tobacco: Never Used  Substance Use Topics  . Alcohol use: Not Currently  . Drug use: Not Currently    Review of Systems  Constitutional: Negative for fever. Eyes: Negative for visual changes. ENT: Negative for sore throat. Cardiovascular: Negative for chest pain. Respiratory: Negative for shortness of breath. Gastrointestinal: Negative for abdominal pain, vomiting and diarrhea. Genitourinary: Negative for dysuria. Musculoskeletal: Negative for back pain. Skin: Negative for rash. Neurological: Negative for focal weakness or numbness. Reports headaches asabove ____________________________________________  PHYSICAL EXAM:  VITAL SIGNS: ED Triage Vitals [01/10/19 1151]  Enc Vitals Group     BP (!) 169/105     Pulse Rate (!) 52     Resp 16     Temp 98.6 F (37 C)     Temp Source Oral     SpO2 100 %     Weight 215 lb (97.5 kg)     Height 5\' 9"  (1.753 m)     Head Circumference      Peak Flow      Pain Score 8     Pain Loc      Pain Edu?      Excl. in [place]?     Constitutional: Alert and  oriented. Well appearing and in no distress.  GCS = 15 Head: Normocephalic and atraumatic. Eyes: Conjunctivae are normal. PERRL. Normal extraocular movements Ears: Canals clear. TMs intact bilaterally. Nose: No congestion/rhinorrhea/epistaxis. Mouth/Throat: Mucous membranes are moist. Neck: Supple. No thyromegaly. Cardiovascular: Normal rate, regular rhythm. Normal distal pulses. Respiratory: Normal respiratory effort. No wheezes/rales/rhonchi. Musculoskeletal: Nontender with normal range of motion in all extremities.  Neurologic:  Normal gait without ataxia. Normal speech and language. No gross focal neurologic  deficits are appreciated. Skin:  Skin is warm, dry and intact. No rash noted. ____________________________________________   LABS (pertinent positives/negatives) Labs Reviewed - No data to display ____________________________________________  PROCEDURES  Atenolol 50 gm PO Tylenol 650 mg PO Pantoprazole 40 mg PO Procedures ____________________________________________  INITIAL IMPRESSION / ASSESSMENT AND PLAN / ED COURSE  Differential diagnosis includes, but is not limited to, intracranial hemorrhage, meningitis/encephalitis, previous head trauma, cavernous venous thrombosis, tension headache, temporal arteritis, migraine or migraine equivalent, idiopathic intracranial hypertension, and non-specific headache.  Patient with a history of hypertension, reflux, and chronic kidney disease, presents for evaluation of a persistent headache.  Patient clinical picture is overall reassuring at this time and she shows no acute intracranial process.  No signs of any acute infectious process, and no neuromuscular deficit.  She reported improvement of her symptoms prior to medication administration.  She has been treated with her blood pressure medicine as well as a reflux pill, and Tylenol in the ED.  She will be discharged with prescriptions for Meprazole as well as atenolol for blood pressure relief.  She is encouraged to take acetaminophen for headaches and avoid use of over-the-counter NSAIDs at this time.  She is also encouraged to establish care with a local community clinic for continued management of her hypertension and CKD.  Patient verbalized understanding and is discharged at this time to follow-up as directed.  Gina Doyle was evaluated in Emergency Department on 01/10/2019 for the symptoms described in the history of present illness. She was evaluated in the context of the global COVID-19 pandemic, which necessitated consideration that the patient might be at risk for infection with the  SARS-CoV-2 virus that causes COVID-19. Institutional protocols and algorithms that pertain to the evaluation of patients at risk for COVID-19 are in a state of rapid change based on information released by regulatory bodies including the CDC and federal and state organizations. These policies and algorithms were followed during the patient's care in the ED. ____________________________________________  FINAL CLINICAL IMPRESSION(S) / ED DIAGNOSES  Final diagnoses:  Acute nonintractable headache, unspecified headache type  Stage 3 chronic kidney disease, unspecified whether stage 3a or 3b CKD  Hypertension, unspecified type      Melvenia Needles, PA-C 01/10/19 1919    Duffy Bruce, MD 01/10/19 2036

## 2019-01-10 NOTE — ED Triage Notes (Signed)
Reports throbbing headache to bilateral temples X 3 days. Pt ran out of her BP meds X 2 week ago and does not have PCP. Pt was taking atenolol 50mg  daily. Pt alert and oriented X4, cooperative, RR even and unlabored, color WNL. Pt in NAD.

## 2019-01-10 NOTE — Discharge Instructions (Signed)
You have been treated for a headache. Your exam is overall normal. You have had refills of your blood pressure & reflux medicines. Continue to monitor your BP and seek primary medical care at a local care center. You have chronic kidney disease, based on your labs. You should avoid anti-inflammatories due to this and your blood pressure. Take OTC Tylenol (acetaminophen) for headache, pain, and fevers. Follow-up with a local clinic or return as needed.

## 2019-04-04 ENCOUNTER — Emergency Department
Admission: EM | Admit: 2019-04-04 | Discharge: 2019-04-04 | Disposition: A | Discharge status: Home / Self Care | Payer: Medicaid Other | Attending: Emergency Medicine | Admitting: Emergency Medicine

## 2019-04-04 ENCOUNTER — Other Ambulatory Visit: Payer: Self-pay

## 2019-04-04 ENCOUNTER — Encounter: Payer: Self-pay | Admitting: Medical Oncology

## 2019-04-04 DIAGNOSIS — N390 Urinary tract infection, site not specified: Secondary | ICD-10-CM | POA: Insufficient documentation

## 2019-04-04 DIAGNOSIS — Z79899 Other long term (current) drug therapy: Secondary | ICD-10-CM | POA: Insufficient documentation

## 2019-04-04 DIAGNOSIS — F17210 Nicotine dependence, cigarettes, uncomplicated: Secondary | ICD-10-CM | POA: Insufficient documentation

## 2019-04-04 DIAGNOSIS — I10 Essential (primary) hypertension: Secondary | ICD-10-CM | POA: Insufficient documentation

## 2019-04-04 LAB — URINALYSIS, COMPLETE (UACMP) WITH MICROSCOPIC
Bacteria, UA: NONE SEEN
Bilirubin Urine: NEGATIVE
Glucose, UA: NEGATIVE mg/dL
Hgb urine dipstick: NEGATIVE
Ketones, ur: NEGATIVE mg/dL
Nitrite: NEGATIVE
Protein, ur: 100 mg/dL — AB
Specific Gravity, Urine: 1.011 (ref 1.005–1.030)
pH: 5 (ref 5.0–8.0)

## 2019-04-04 LAB — POCT PREGNANCY, URINE: Preg Test, Ur: NEGATIVE

## 2019-04-04 MED ORDER — CEPHALEXIN 500 MG PO CAPS: 500.0000 mg | ORAL_CAPSULE | Freq: Three times a day (TID) | ORAL | 0 refills | Status: DC

## 2019-04-04 NOTE — ED Triage Notes (Signed)
Pt reports lower back pain since last night. Pt ambulatory without difficulty.

## 2019-04-04 NOTE — ED Notes (Signed)
See triage note  Presents with lower back pain  States pain started in mid lower back and moves across back   Also has had some urinary sxs'

## 2019-04-04 NOTE — Discharge Instructions (Signed)
Follow-up with your primary care provider if any continued problems or concerns.  You may take Tylenol or ibuprofen as needed for back pain.  You may also use warm compresses if needed for comfort.  Increase fluids.  Take antibiotics until completely finished.  If there is any worsening of your symptoms such as fever, chills, nausea or vomiting or inability to take the antibiotic return to the emergency department over the weekend.

## 2019-04-04 NOTE — ED Provider Notes (Signed)
[facility] Emergency Department Provider Note  ____________________________________________   First MD Initiated Contact with Patient 04/04/19 [phone]     (approximate)  I have reviewed the triage vital signs and the nursing notes.   HISTORY  Chief Complaint Back Pain   HPI Gina Doyle is a 44 y.o. female presents to the ED with complaint of low back pain that started initially as mid lower back pain and then had increased pain last evening.  Patient states there is been no injury to her back.  She states that she is used to having muscle strains from lifting at work but this feels different.  She also reports that she has had some urinary symptoms and also that her urine smells "bad".  She denies any fever, chills, nausea or vomiting.  She rates her pain as an 8 out of 10.       Past Medical History:  Diagnosis Date  . Hypertension     There are no problems to display for this patient.   History reviewed. No pertinent surgical history.  Prior to Admission medications   Medication Sig Start Date End Date Taking? Authorizing Provider  atenolol (TENORMIN) 50 MG tablet Take 50 mg by mouth daily.    [provider]  atenolol (TENORMIN) 50 MG tablet Take 1 tablet (50 mg total) by mouth daily. 01/10/19 04/10/19  Menshew, Dannielle Karvonen, PA-C  cephALEXin (KEFLEX) 500 MG capsule Take 1 capsule (500 mg total) by mouth 3 (three) times daily. 04/04/19   Johnn Hai, PA-C  omeprazole (PRILOSEC) 40 MG capsule Take 1 capsule (40 mg total) by mouth daily. 01/10/19 04/10/19  Menshew, Dannielle Karvonen, PA-C  metoCLOPramide (REGLAN) 10 MG tablet Take 1 tablet (10 mg total) by mouth every 8 (eight) hours as needed for up to 5 days for nausea (or headache). 11/27/17 01/10/19  Arta Silence, MD  omeprazole (PRILOSEC OTC) 20 MG tablet Take 20 mg by mouth daily.  01/10/19  [provider]    Allergies Patient has no known allergies.  No family  history on file.  Social History Social History   Tobacco Use  . Smoking status: Current Every Day Smoker    Packs/day: 1.00    Types: Cigarettes  . Smokeless tobacco: Never Used  Substance Use Topics  . Alcohol use: Not Currently  . Drug use: Not Currently    Review of Systems Constitutional: No fever/chills Cardiovascular: Denies chest pain. Respiratory: Denies shortness of breath. Gastrointestinal: No abdominal pain.  No nausea, no vomiting.  Genitourinary: Denies urinary frequency but does report foul-smelling urine. Musculoskeletal: Positive right-sided back pain. Skin: Negative for rash. Neurological: Negative for headaches, focal weakness or numbness. ____________________________________________   PHYSICAL EXAM:  VITAL SIGNS: ED Triage Vitals  Enc Vitals Group     BP 04/04/19 0916 (!) 160/100     Pulse Rate 04/04/19 0916 71     Resp 04/04/19 0916 18     Temp 04/04/19 0916 98.2 F (36.8 C)     Temp Source 04/04/19 0916 Oral     SpO2 04/04/19 0916 100 %     Weight 04/04/19 0839 213 lb 13.5 oz (97 kg)     Height --      Head Circumference --      Peak Flow --      Pain Score 04/04/19 0839 8     Pain Loc --      Pain Edu? --      Excl.  in [place]? --    Constitutional: Alert and oriented. Well appearing and in no acute distress. Eyes: Conjunctivae are normal.  Head: Atraumatic. Neck: No stridor.   Cardiovascular: Normal rate, regular rhythm. Grossly normal heart sounds.  Good peripheral circulation. Respiratory: Normal respiratory effort.  No retractions. Lungs CTAB. Gastrointestinal: Soft and nontender. No distention.  No CVA tenderness. Musculoskeletal: There is no point tenderness on palpation of the lumbar spine however there is right lateral musculoskeletal tenderness.  Range of motion is guarded secondary to discomfort.  Patient is able to stand without any assistance and ambulate without difficulty.  Good muscle strength bilaterally.  No step-offs or point  tenderness is appreciated on palpation of the thoracic or lumbar spine. Neurologic:  Normal speech and language. No gross focal neurologic deficits are appreciated. No gait instability. Skin:  Skin is warm, dry and intact.  Psychiatric: Mood and affect are normal. Speech and behavior are normal.  ____________________________________________   LABS (all labs ordered are listed, but only abnormal results are displayed)  Labs Reviewed  URINALYSIS, COMPLETE (UACMP) WITH MICROSCOPIC - Abnormal; Notable for the following components:      Result Value   Color, Urine YELLOW (*)    APPearance CLOUDY (*)    Protein, ur 100 (*)    Leukocytes,Ua LARGE (*)    All other components within normal limits  POC URINE PREG, ED  POCT PREGNANCY, URINE    PROCEDURES  Procedure(s) performed (including Critical Care):  Procedures   ____________________________________________   INITIAL IMPRESSION / ASSESSMENT AND PLAN / ED COURSE  As part of my medical decision making, I reviewed the following data within the electronic MEDICAL RECORD NUMBER Notes from prior ED visits and [place] Controlled Substance Database  44 year old female presents to the ED with low back pain that started yesterday but was worse this morning.  Patient denies any known injury to her back.  She denies any fever, chills, nausea or vomiting.  She does report some urinary symptoms.  Urinalysis is consistent with a UTI.  Patient was placed on Keflex 500 mg 3 times daily for 10 days.  She is to increase fluids.  Tylenol or ibuprofen as needed for discomfort.  Blood pressure was rechecked prior to discharge and patient states that she continues to take her atenolol as prescribed by her doctor.  She is to follow-up with her PCP if any continued problems. ____________________________________________   FINAL CLINICAL IMPRESSION(S) / ED DIAGNOSES  Final diagnoses:  Acute urinary tract infection     ED Discharge Orders         Ordered     cephALEXin (KEFLEX) 500 MG capsule  3 times daily     04/04/19 1057           Note:  This document was prepared using Dragon voice recognition software and may include unintentional dictation errors.    Johnn Hai, PA-C 04/04/19 1238    Carrie Mew, MD 04/06/19 [phone]

## 2020-03-15 DIAGNOSIS — Z8616 Personal history of COVID-19: Secondary | ICD-10-CM

## 2020-03-15 HISTORY — DX: Personal history of COVID-19: Z86.16

## 2020-03-24 ENCOUNTER — Emergency Department
Admission: EM | Admit: 2020-03-24 | Discharge: 2020-03-24 | Disposition: A | Discharge status: Home / Self Care | Payer: HRSA Program | Attending: Emergency Medicine | Admitting: Emergency Medicine

## 2020-03-24 ENCOUNTER — Other Ambulatory Visit: Payer: Self-pay

## 2020-03-24 ENCOUNTER — Encounter: Payer: Self-pay | Admitting: Emergency Medicine

## 2020-03-24 DIAGNOSIS — Z79899 Other long term (current) drug therapy: Secondary | ICD-10-CM | POA: Diagnosis not present

## 2020-03-24 DIAGNOSIS — U071 COVID-19: Secondary | ICD-10-CM | POA: Insufficient documentation

## 2020-03-24 DIAGNOSIS — Z20822 Contact with and (suspected) exposure to covid-19: Secondary | ICD-10-CM

## 2020-03-24 DIAGNOSIS — F17210 Nicotine dependence, cigarettes, uncomplicated: Secondary | ICD-10-CM | POA: Insufficient documentation

## 2020-03-24 DIAGNOSIS — N189 Chronic kidney disease, unspecified: Secondary | ICD-10-CM | POA: Insufficient documentation

## 2020-03-24 DIAGNOSIS — I129 Hypertensive chronic kidney disease with stage 1 through stage 4 chronic kidney disease, or unspecified chronic kidney disease: Secondary | ICD-10-CM | POA: Diagnosis not present

## 2020-03-24 DIAGNOSIS — R059 Cough, unspecified: Secondary | ICD-10-CM | POA: Diagnosis present

## 2020-03-24 DIAGNOSIS — B349 Viral infection, unspecified: Secondary | ICD-10-CM | POA: Insufficient documentation

## 2020-03-24 HISTORY — DX: Chronic kidney disease, unspecified: N18.9

## 2020-03-24 LAB — SARS CORONAVIRUS 2 (TAT 6-24 HRS): SARS Coronavirus 2: POSITIVE — AB

## 2020-03-24 MED ORDER — HYDROCOD POLST-CPM POLST ER 10-8 MG/5ML PO SUER: 5.0000 mL | Freq: Two times a day (BID) | ORAL | 0 refills | Status: DC | PRN

## 2020-03-24 NOTE — ED Notes (Signed)
AAOx3.  Skin warm and dry.  NAD 

## 2020-03-24 NOTE — ED Triage Notes (Signed)
C/O legs aching today.  Sore throat today as well.  AAOx3.  Skin warm and dry. NAD

## 2020-03-24 NOTE — Discharge Instructions (Addendum)
As we discussed, we believe your symptoms are caused by a respiratory virus.  Stay hydrated with water, Gatorade, or other clear fluids, and use ibupropfen and/or acetaminophen (Tylenol) as needed for pain and fever (according to label instructions).  Because we cannot rule out the possibility of COVID-19 at this time, we sent a nasal swab test from the Emergency Department (ED).  Please read through the included information in this paperwork for information about how to set up a MyChart account so that you can log in over the next couple of days for your test results (including COVID-19 swab results if one was obtained during your Emergency Department visit).  Read through all the included information including the recommendations from the CDC.  If your test is positive, we recommend that you self-quarantine at home for about a week.  You should have as minimal contact as possible with anyone else including close family as per the [facility] paperwork guidelines listed below. Follow-up with your doctor by phone or online as needed and return immediately to the emergency department or call 911 only if you develop new or worsening symptoms that concern you.  You can find up-to-date information about COVID-19 in [place] by calling the [facility]: [phone]. You may also call 2-1-1, or [phone], or additional resources.  You can also find information online at [email or web address]-white.com/, or on the Center for Disease Control (CDC) website at BeginnerSteps.be.

## 2020-03-24 NOTE — ED Provider Notes (Signed)
[facility] Emergency Department Provider Note  ____________________________________________   Event Date/Time   First MD Initiated Contact with Patient 03/24/20 1130     (approximate)  I have reviewed the triage vital signs and the nursing notes.   HISTORY  Chief Complaint No chief complaint on file.    HPI Gina Doyle is a 45 y.o. female  with PMH as listed below who presents with two days of cough, now acute onset this morning of chills, sore throat, and body aches.  No loss of smell or taste.   No chest pain nor SOB, just cough, worse at night.  Her daughter received her positive COVID diagnosis about 4 days ago.  Patient reports having two COVID vaccinations and finished her course at least a few months ago, maybe longer.        Past Medical History:  Diagnosis Date  . CKD (chronic kidney disease)   . Hypertension     There are no problems to display for this patient.   History reviewed. No pertinent surgical history.  Prior to Admission medications   Medication Sig Start Date End Date Taking? Authorizing Provider  chlorpheniramine-HYDROcodone (TUSSIONEX PENNKINETIC ER) 10-8 MG/5ML SUER Take 5 mLs by mouth every 12 (twelve) hours as needed for cough. 03/24/20  Yes Hinda Kehr, MD  atenolol (TENORMIN) 50 MG tablet Take 50 mg by mouth daily.    [provider]  atenolol (TENORMIN) 50 MG tablet Take 1 tablet (50 mg total) by mouth daily. 01/10/19 04/10/19  Menshew, Dannielle Karvonen, PA-C  cephALEXin (KEFLEX) 500 MG capsule Take 1 capsule (500 mg total) by mouth 3 (three) times daily. 04/04/19   Johnn Hai, PA-C  omeprazole (PRILOSEC) 40 MG capsule Take 1 capsule (40 mg total) by mouth daily. 01/10/19 04/10/19  Menshew, Dannielle Karvonen, PA-C  metoCLOPramide (REGLAN) 10 MG tablet Take 1 tablet (10 mg total) by mouth every 8 (eight) hours as needed for up to 5 days for nausea (or headache). 11/27/17 01/10/19  Arta Silence, MD   omeprazole (PRILOSEC OTC) 20 MG tablet Take 20 mg by mouth daily.  01/10/19  [provider]    Allergies Patient has no known allergies.  History reviewed. No pertinent family history.  Social History Social History   Tobacco Use  . Smoking status: Current Every Day Smoker    Packs/day: 1.00    Types: Cigarettes  . Smokeless tobacco: Never Used  Substance Use Topics  . Alcohol use: Not Currently  . Drug use: Not Currently    Review of Systems Constitutional: +Chills Eyes: No visual changes.  No loss of smell/taste.  Some congestion. ENT: +sore throat. Cardiovascular: Denies chest pain. Respiratory: Denies shortness of breath.  +Cough. Gastrointestinal: No abdominal pain.  Some nausea, +decreased appetite. Genitourinary: Negative for dysuria. Musculoskeletal: Generalized body aches, worse in both legs.  No swelling or redness. Integumentary: Negative for rash. Neurological: Negative for headaches, focal weakness or numbness.   ____________________________________________   PHYSICAL EXAM:  VITAL SIGNS: ED Triage Vitals  Enc Vitals Group     BP 03/24/20 1102 (!) 156/99     Pulse Rate 03/24/20 1102 85     Resp 03/24/20 1102 20     Temp 03/24/20 1102 (!) 100.7 F (38.2 C)     Temp Source 03/24/20 1102 Oral     SpO2 03/24/20 1102 100 %     Weight 03/24/20 1058 97 kg (213 lb 13.5 oz)     Height 03/24/20  1058 1.753 m (5\' 9" )     Head Circumference --      Peak Flow --      Pain Score 03/24/20 1058 6     Pain Loc --      Pain Edu? --      Excl. in [place]? --     Constitutional: Alert and oriented.  Eyes: Conjunctivae are normal.  Head: Atraumatic. Nose: Mild congestion/rhinnorhea. Mouth/Throat: Oropharynx clear, no exudate nor erythema. Neck: No stridor.  No meningeal signs.   Cardiovascular: Normal rate, regular rhythm. Good peripheral circulation. Respiratory: Normal respiratory effort.  No retractions. Clear lungs. Occasional  cough. Gastrointestinal: Soft and nontender. No distention.  Musculoskeletal: No leg swelling. Neurologic:  Normal speech and language. No gross focal neurologic deficits are appreciated.  Skin:  Skin is warm, dry and intact.   ____________________________________________   LABS (all labs ordered are listed, but only abnormal results are displayed)  Labs Reviewed  SARS CORONAVIRUS 2 (TAT 6-24 HRS)   ____________________________________________  EKG  No indication for emergent EKG ____________________________________________  RADIOLOGY I, Hinda Kehr, personally viewed and evaluated these images (plain radiographs) as part of my medical decision making, as well as reviewing the written report by the radiologist.  ED MD interpretation:  No indication for emergent imaging  Official radiology report(s): No results found.  ____________________________________________   PROCEDURES   Procedure(s) performed (including Critical Care):  Procedures   ____________________________________________   INITIAL IMPRESSION / MDM / [place] / ED COURSE  As part of my medical decision making, I reviewed the following data within the [place] notes reviewed and incorporated, Old chart reviewed, Notes from prior ED visits and [place] Controlled Substance Database   Highly suspect COVID-19.  Mild symptoms, hemodynamically stable.  No indication for further workup.  Rx as listed below.  Gave usual advice/return precautions.  Send-out test ordered, patient knows isolation recommendations and how to follow up with MyChart.           ____________________________________________  FINAL CLINICAL IMPRESSION(S) / ED DIAGNOSES  Final diagnoses:  Suspected COVID-19 virus infection  Acute viral syndrome     MEDICATIONS GIVEN DURING THIS VISIT:  Medications - No data to display   ED Discharge Orders         Ordered     chlorpheniramine-HYDROcodone (TUSSIONEX PENNKINETIC ER) 10-8 MG/5ML SUER  Every 12 hours PRN        03/24/20 1135          *Please note:  Gina Doyle was evaluated in Emergency Department on 03/24/2020 for the symptoms described in the history of present illness. She was evaluated in the context of the global COVID-19 pandemic, which necessitated consideration that the patient might be at risk for infection with the SARS-CoV-2 virus that causes COVID-19. Institutional protocols and algorithms that pertain to the evaluation of patients at risk for COVID-19 are in a state of rapid change based on information released by regulatory bodies including the CDC and federal and state organizations. These policies and algorithms were followed during the patient's care in the ED.  Some ED evaluations and interventions may be delayed as a result of limited staffing during and after the pandemic.*  Note:  This document was prepared using Dragon voice recognition software and may include unintentional dictation errors.   Hinda Kehr, MD 03/24/20 1150

## 2020-04-04 ENCOUNTER — Encounter: Payer: Self-pay | Admitting: Emergency Medicine

## 2020-04-04 ENCOUNTER — Emergency Department: Payer: Medicaid Other

## 2020-04-04 ENCOUNTER — Other Ambulatory Visit: Payer: Self-pay

## 2020-04-04 DIAGNOSIS — Z5321 Procedure and treatment not carried out due to patient leaving prior to being seen by health care provider: Secondary | ICD-10-CM | POA: Insufficient documentation

## 2020-04-04 DIAGNOSIS — K219 Gastro-esophageal reflux disease without esophagitis: Secondary | ICD-10-CM | POA: Insufficient documentation

## 2020-04-04 DIAGNOSIS — R072 Precordial pain: Secondary | ICD-10-CM | POA: Insufficient documentation

## 2020-04-04 LAB — CBC
HCT: 31.9 % — ABNORMAL LOW (ref 36.0–46.0)
Hemoglobin: 9.4 g/dL — ABNORMAL LOW (ref 12.0–15.0)
MCH: 21.4 pg — ABNORMAL LOW (ref 26.0–34.0)
MCHC: 29.5 g/dL — ABNORMAL LOW (ref 30.0–36.0)
MCV: 72.5 fL — ABNORMAL LOW (ref 80.0–100.0)
Platelets: 449 10*3/uL — ABNORMAL HIGH (ref 150–400)
RBC: 4.4 MIL/uL (ref 3.87–5.11)
RDW: 21.3 % — ABNORMAL HIGH (ref 11.5–15.5)
WBC: 7.4 10*3/uL (ref 4.0–10.5)
nRBC: 0 % (ref 0.0–0.2)

## 2020-04-04 LAB — BASIC METABOLIC PANEL
Anion gap: 10 (ref 5–15)
BUN: 18 mg/dL (ref 6–20)
CO2: 20 mmol/L — ABNORMAL LOW (ref 22–32)
Calcium: 9.8 mg/dL (ref 8.9–10.3)
Chloride: 109 mmol/L (ref 98–111)
Creatinine, Ser: 2.24 mg/dL — ABNORMAL HIGH (ref 0.44–1.00)
GFR, Estimated: 27 mL/min — ABNORMAL LOW (ref >60)
Glucose, Bld: 116 mg/dL — ABNORMAL HIGH (ref 70–99)
Potassium: 4.2 mmol/L (ref 3.5–5.1)
Sodium: 139 mmol/L (ref 135–145)

## 2020-04-04 LAB — TROPONIN I (HIGH SENSITIVITY)
Troponin I (High Sensitivity): 33 ng/L — ABNORMAL HIGH (ref <18)
Troponin I (High Sensitivity): 34 ng/L — ABNORMAL HIGH (ref <18)

## 2020-04-04 NOTE — ED Triage Notes (Signed)
Pt to ED via POV with c/o substernal chest discomfort. Pt states has been trying to "rub it out" all day, pt states pain x 3 days. Pt also c/o worsening acid reflux x several weeks. Pt A&O x4, NAD noted on arrival to ED.

## 2020-04-05 ENCOUNTER — Emergency Department
Admission: EM | Admit: 2020-04-05 | Discharge: 2020-04-05 | Disposition: A | Discharge status: Home / Self Care | Payer: Medicaid Other | Attending: Emergency Medicine | Admitting: Emergency Medicine

## 2020-07-28 ENCOUNTER — Encounter: Payer: Self-pay | Admitting: Emergency Medicine

## 2020-07-28 ENCOUNTER — Other Ambulatory Visit: Payer: Self-pay

## 2020-07-28 ENCOUNTER — Emergency Department
Admission: EM | Admit: 2020-07-28 | Discharge: 2020-07-28 | Disposition: A | Discharge status: Home / Self Care | Payer: Medicaid Other | Attending: Emergency Medicine | Admitting: Emergency Medicine

## 2020-07-28 DIAGNOSIS — X509XXA Other and unspecified overexertion or strenuous movements or postures, initial encounter: Secondary | ICD-10-CM | POA: Insufficient documentation

## 2020-07-28 DIAGNOSIS — M546 Pain in thoracic spine: Secondary | ICD-10-CM | POA: Insufficient documentation

## 2020-07-28 DIAGNOSIS — Y92512 Supermarket, store or market as the place of occurrence of the external cause: Secondary | ICD-10-CM | POA: Insufficient documentation

## 2020-07-28 DIAGNOSIS — S46912A Strain of unspecified muscle, fascia and tendon at shoulder and upper arm level, left arm, initial encounter: Secondary | ICD-10-CM | POA: Insufficient documentation

## 2020-07-28 DIAGNOSIS — N189 Chronic kidney disease, unspecified: Secondary | ICD-10-CM | POA: Insufficient documentation

## 2020-07-28 DIAGNOSIS — M545 Low back pain, unspecified: Secondary | ICD-10-CM | POA: Insufficient documentation

## 2020-07-28 DIAGNOSIS — F17210 Nicotine dependence, cigarettes, uncomplicated: Secondary | ICD-10-CM | POA: Insufficient documentation

## 2020-07-28 DIAGNOSIS — Y990 Civilian activity done for income or pay: Secondary | ICD-10-CM | POA: Insufficient documentation

## 2020-07-28 DIAGNOSIS — Z79899 Other long term (current) drug therapy: Secondary | ICD-10-CM | POA: Insufficient documentation

## 2020-07-28 DIAGNOSIS — I129 Hypertensive chronic kidney disease with stage 1 through stage 4 chronic kidney disease, or unspecified chronic kidney disease: Secondary | ICD-10-CM | POA: Insufficient documentation

## 2020-07-28 MED ORDER — CYCLOBENZAPRINE HCL 10 MG PO TABS
10.0000 mg | ORAL_TABLET | Freq: Once | ORAL | Status: AC
  Administered 2020-07-28: 10 mg via ORAL
  Filled 2020-07-28: qty 1

## 2020-07-28 MED ORDER — ORPHENADRINE CITRATE ER 100 MG PO TB12: 100.0000 mg | ORAL_TABLET | Freq: Two times a day (BID) | ORAL | 0 refills | Status: DC

## 2020-07-28 MED ORDER — LIDOCAINE 5 % EX PTCH
1.0000 | MEDICATED_PATCH | CUTANEOUS | Status: DC
  Administered 2020-07-28: 1 via TRANSDERMAL
  Filled 2020-07-28: qty 1

## 2020-07-28 MED ORDER — NAPROXEN 500 MG PO TABS
500.0000 mg | ORAL_TABLET | Freq: Once | ORAL | Status: AC
  Administered 2020-07-28: 500 mg via ORAL
  Filled 2020-07-28: qty 1

## 2020-07-28 MED ORDER — NAPROXEN 500 MG PO TABS: 500.0000 mg | ORAL_TABLET | Freq: Two times a day (BID) | ORAL | Status: DC

## 2020-07-28 MED ORDER — LIDOCAINE 5 % EX PTCH: 1.0000 | MEDICATED_PATCH | Freq: Two times a day (BID) | CUTANEOUS | 0 refills | Status: DC

## 2020-07-28 NOTE — ED Triage Notes (Signed)
C?O mid back pain since Thursday.  Denies injury, but states she works at [employer] and could have picked up something heavy.  AAOx3.  Skin warm and dry. NAD. Posture relaxed.

## 2020-07-28 NOTE — ED Notes (Signed)
See triage note  Presents with mid back pin since Thursday  Unsure of injury but states she does lift heavy things at work  No fever or cough  Ambulates well to treatment room

## 2020-07-28 NOTE — Discharge Instructions (Signed)
Read and follow discharge care instruction.  Take medication as directed

## 2020-07-28 NOTE — ED Provider Notes (Signed)
[facility] Emergency Department Provider Note   ____________________________________________   Event Date/Time   First MD Initiated Contact with Patient 07/28/20 [phone]     (approximate)  I have reviewed the triage vital signs and the nursing notes.   HISTORY  Chief Complaint Back Pain    HPI Gina Doyle is a 45 y.o. female patient complain of low back pain for 4 days.  Patient denies any specific provocative incident.  Patient states she works at [employer] and does perform heavy lifting.  Patient describes the pain as "sharp/burning".  Rates pain as a 4/10.  States only mild transient relief with over-the-counter topical analgesics.         Past Medical History:  Diagnosis Date  . CKD (chronic kidney disease)   . Hypertension     There are no problems to display for this patient.   History reviewed. No pertinent surgical history.  Prior to Admission medications   Medication Sig Start Date End Date Taking? Authorizing Provider  lidocaine (LIDODERM) 5 % Place 1 patch onto the skin every 12 (twelve) hours. Remove & Discard patch within 12 hours or as directed by MD 07/28/20 07/28/21 Yes Sable Feil, PA-C  naproxen (NAPROSYN) 500 MG tablet Take 1 tablet (500 mg total) by mouth 2 (two) times daily with a meal. 07/28/20  Yes Sable Feil, PA-C  orphenadrine (NORFLEX) 100 MG tablet Take 1 tablet (100 mg total) by mouth 2 (two) times daily. 07/28/20  Yes Sable Feil, PA-C  atenolol (TENORMIN) 50 MG tablet Take 50 mg by mouth daily.    [provider]  atenolol (TENORMIN) 50 MG tablet Take 1 tablet (50 mg total) by mouth daily. 01/10/19 04/10/19  Menshew, Dannielle Karvonen, PA-C  omeprazole (PRILOSEC) 40 MG capsule Take 1 capsule (40 mg total) by mouth daily. 01/10/19 04/10/19  Menshew, Dannielle Karvonen, PA-C  metoCLOPramide (REGLAN) 10 MG tablet Take 1 tablet (10 mg total) by mouth every 8 (eight) hours as needed for up to 5 days for nausea  (or headache). 11/27/17 01/10/19  Arta Silence, MD  omeprazole (PRILOSEC OTC) 20 MG tablet Take 20 mg by mouth daily.  01/10/19  [provider]    Allergies Patient has no known allergies.  No family history on file.  Social History Social History   Tobacco Use  . Smoking status: Current Every Day Smoker    Packs/day: 1.00    Types: Cigarettes  . Smokeless tobacco: Never Used  Substance Use Topics  . Alcohol use: Not Currently  . Drug use: Not Currently    Review of Systems Constitutional: No fever/chills Eyes: No visual changes. ENT: No sore throat. Cardiovascular: Denies chest pain. Respiratory: Denies shortness of breath. Gastrointestinal: No abdominal pain.  No nausea, no vomiting.  No diarrhea.  No constipation. Genitourinary: Negative for dysuria. Musculoskeletal: Negative for back pain. Skin: Negative for rash. Neurological: Negative for headaches, focal weakness or numbness. Endocrine:  Chronic kidney disease and hypertension.   ____________________________________________   PHYSICAL EXAM:  VITAL SIGNS: ED Triage Vitals  Enc Vitals Group     BP 07/28/20 0737 (!) 159/95     Pulse Rate 07/28/20 0737 78     Resp 07/28/20 0737 15     Temp 07/28/20 0737 98.3 F (36.8 C)     Temp Source 07/28/20 0737 Oral     SpO2 07/28/20 0737 98 %     Weight 07/28/20 0728 213 lb 13.5 oz (97 kg)  Height 07/28/20 0728 '5\' 9"'$  (1.753 m)     Head Circumference --      Peak Flow --      Pain Score 07/28/20 0728 4     Pain Loc --      Pain Edu? --      Excl. in [place]? --     Constitutional: Alert and oriented. Well appearing and in no acute distress. Neck: No stridor.  No cervical spine tenderness to palpation. Hematological/Lymphatic/Immunilogical: No cervical lymphadenopathy. Cardiovascular: Normal rate, regular rhythm. Grossly normal heart sounds.  Good peripheral circulation. Respiratory: Normal respiratory effort.  No retractions. Lungs  CTAB. Gastrointestinal: Soft and nontender. No distention. No abdominal bruits. No CVA tenderness. Genitourinary: Deferred Musculoskeletal: No obvious thoracic or upper extremity deformity.  Patient has full Nikkel range of motion of extremities.  Patient has moderate guarding palpation of the left medial aspect of the mid scapular area. Neurologic:  Normal speech and language. No gross focal neurologic deficits are appreciated. No gait instability. Skin:  Skin is warm, dry and intact. No rash noted. Psychiatric: Mood and affect are normal. Speech and behavior are normal.  ____________________________________________   LABS (all labs ordered are listed, but only abnormal results are displayed)  Labs Reviewed - No data to display ____________________________________________  EKG   ____________________________________________  RADIOLOGY I, Sable Feil, personally viewed and evaluated these images (plain radiographs) as part of my medical decision making, as well as reviewing the written report by the radiologist.  ED MD interpretation:    Official radiology report(s): No results found.  ____________________________________________   PROCEDURES  Procedure(s) performed (including Critical Care):  Procedures   ____________________________________________   INITIAL IMPRESSION / ASSESSMENT AND PLAN / ED COURSE  As part of my medical decision making, I reviewed the following data within the [place]         Patient complains of mid back pain for 4 days.  Patient complaint physical exam consistent with left scapular muscle strain.  Patient given discharge care instruction advised take medication as directed.  Patient advised [facility].      ____________________________________________   FINAL CLINICAL IMPRESSION(S) / ED DIAGNOSES  Final diagnoses:  Muscle strain of left scapular region, initial encounter     ED  Discharge Orders         Ordered    orphenadrine (NORFLEX) 100 MG tablet  2 times daily        07/28/20 0806    naproxen (NAPROSYN) 500 MG tablet  2 times daily with meals        07/28/20 0806    lidocaine (LIDODERM) 5 %  Every 12 hours        07/28/20 0806          *Please note:  Gina Doyle was evaluated in Emergency Department on 07/28/2020 for the symptoms described in the history of present illness. She was evaluated in the context of the global COVID-19 pandemic, which necessitated consideration that the patient might be at risk for infection with the SARS-CoV-2 virus that causes COVID-19. Institutional protocols and algorithms that pertain to the evaluation of patients at risk for COVID-19 are in a state of rapid change based on information released by regulatory bodies including the CDC and federal and state organizations. These policies and algorithms were followed during the patient's care in the ED.  Some ED evaluations and interventions may be delayed as a result of limited staffing during and the pandemic.*  Note:  This document was prepared using Dragon voice recognition software and may include unintentional dictation errors.    Sable Feil, PA-C 07/28/20 [id number]    Arta Silence, MD 07/28/20 [phone]

## 2020-08-13 ENCOUNTER — Other Ambulatory Visit: Payer: Self-pay

## 2020-08-13 ENCOUNTER — Emergency Department
Admission: EM | Admit: 2020-08-13 | Discharge: 2020-08-13 | Disposition: A | Discharge status: Home / Self Care | Payer: Self-pay | Attending: Emergency Medicine | Admitting: Emergency Medicine

## 2020-08-13 ENCOUNTER — Emergency Department: Payer: Self-pay

## 2020-08-13 DIAGNOSIS — F17210 Nicotine dependence, cigarettes, uncomplicated: Secondary | ICD-10-CM | POA: Insufficient documentation

## 2020-08-13 DIAGNOSIS — N189 Chronic kidney disease, unspecified: Secondary | ICD-10-CM | POA: Insufficient documentation

## 2020-08-13 DIAGNOSIS — R1010 Upper abdominal pain, unspecified: Secondary | ICD-10-CM

## 2020-08-13 DIAGNOSIS — K279 Peptic ulcer, site unspecified, unspecified as acute or chronic, without hemorrhage or perforation: Secondary | ICD-10-CM | POA: Insufficient documentation

## 2020-08-13 DIAGNOSIS — I129 Hypertensive chronic kidney disease with stage 1 through stage 4 chronic kidney disease, or unspecified chronic kidney disease: Secondary | ICD-10-CM | POA: Insufficient documentation

## 2020-08-13 DIAGNOSIS — Z79899 Other long term (current) drug therapy: Secondary | ICD-10-CM | POA: Insufficient documentation

## 2020-08-13 LAB — CBC
HCT: 29.6 % — ABNORMAL LOW (ref 36.0–46.0)
Hemoglobin: 8.9 g/dL — ABNORMAL LOW (ref 12.0–15.0)
MCH: 21.5 pg — ABNORMAL LOW (ref 26.0–34.0)
MCHC: 30.1 g/dL (ref 30.0–36.0)
MCV: 71.5 fL — ABNORMAL LOW (ref 80.0–100.0)
Platelets: 448 10*3/uL — ABNORMAL HIGH (ref 150–400)
RBC: 4.14 MIL/uL (ref 3.87–5.11)
RDW: 20.4 % — ABNORMAL HIGH (ref 11.5–15.5)
WBC: 7.2 10*3/uL (ref 4.0–10.5)
nRBC: 0 % (ref 0.0–0.2)

## 2020-08-13 LAB — URINALYSIS, COMPLETE (UACMP) WITH MICROSCOPIC
Bacteria, UA: NONE SEEN
Bilirubin Urine: NEGATIVE
Glucose, UA: NEGATIVE mg/dL
Hgb urine dipstick: NEGATIVE
Ketones, ur: NEGATIVE mg/dL
Nitrite: NEGATIVE
Protein, ur: 100 mg/dL — AB
Specific Gravity, Urine: 1.011 (ref 1.005–1.030)
pH: 7 (ref 5.0–8.0)

## 2020-08-13 LAB — POC URINE PREG, ED: Preg Test, Ur: NEGATIVE

## 2020-08-13 LAB — COMPREHENSIVE METABOLIC PANEL
ALT: 15 U/L (ref 0–44)
AST: 22 U/L (ref 15–41)
Albumin: 3.4 g/dL — ABNORMAL LOW (ref 3.5–5.0)
Alkaline Phosphatase: 73 U/L (ref 38–126)
Anion gap: 8 (ref 5–15)
BUN: 20 mg/dL (ref 6–20)
CO2: 20 mmol/L — ABNORMAL LOW (ref 22–32)
Calcium: 10.4 mg/dL — ABNORMAL HIGH (ref 8.9–10.3)
Chloride: 109 mmol/L (ref 98–111)
Creatinine, Ser: 2.29 mg/dL — ABNORMAL HIGH (ref 0.44–1.00)
GFR, Estimated: 26 mL/min — ABNORMAL LOW (ref >60)
Glucose, Bld: 91 mg/dL (ref 70–99)
Potassium: 4.2 mmol/L (ref 3.5–5.1)
Sodium: 137 mmol/L (ref 135–145)
Total Bilirubin: 0.6 mg/dL (ref 0.3–1.2)
Total Protein: 7.4 g/dL (ref 6.5–8.1)

## 2020-08-13 LAB — LIPASE, BLOOD: Lipase: 30 U/L (ref 11–51)

## 2020-08-13 MED ORDER — ALUM & MAG HYDROXIDE-SIMETH 200-200-20 MG/5ML PO SUSP
30.0000 mL | Freq: Once | ORAL | Status: AC
  Administered 2020-08-13: 30 mL via ORAL
  Filled 2020-08-13: qty 30

## 2020-08-13 MED ORDER — PANTOPRAZOLE SODIUM 20 MG PO TBEC: 20.0000 mg | DELAYED_RELEASE_TABLET | Freq: Every day | ORAL | 1 refills | Status: DC

## 2020-08-13 MED ORDER — LIDOCAINE VISCOUS HCL 2 % MT SOLN
15.0000 mL | Freq: Once | OROMUCOSAL | Status: AC
  Administered 2020-08-13: 15 mL via ORAL
  Filled 2020-08-13: qty 15

## 2020-08-13 MED ORDER — SUCRALFATE 1 G PO TABS: 1.0000 g | ORAL_TABLET | Freq: Four times a day (QID) | ORAL | 1 refills | Status: DC

## 2020-08-13 MED ORDER — ONDANSETRON HCL 4 MG/2ML IJ SOLN
4.0000 mg | Freq: Once | INTRAMUSCULAR | Status: AC
  Administered 2020-08-13: 4 mg via INTRAVENOUS
  Filled 2020-08-13: qty 2

## 2020-08-13 MED ORDER — MORPHINE SULFATE (PF) 4 MG/ML IV SOLN
4.0000 mg | Freq: Once | INTRAVENOUS | Status: AC
  Administered 2020-08-13: 4 mg via INTRAVENOUS
  Filled 2020-08-13: qty 1

## 2020-08-13 NOTE — ED Notes (Signed)
Pt verbalizes relief of pain after IV morphine. Pt resting in bed with eyes closed.

## 2020-08-13 NOTE — ED Triage Notes (Signed)
Pt comes with c/o abdominal pain that is burning. Pt states acid reflux and indigestion. Pt states this started Saturday. Pt states 3 days of vomiting.

## 2020-08-13 NOTE — Discharge Instructions (Signed)
Follow-up for regular doctor.  Also follow-up with the GI specialist.  Phone numbers listed above.  Return emergency department worsening.  Take your medications as prescribed.

## 2020-08-13 NOTE — ED Provider Notes (Signed)
[facility] Emergency Department Provider Note  ____________________________________________   Event Date/Time   First MD Initiated Contact with Patient 08/13/20 1350     (approximate)  I have reviewed the triage vital signs and the nursing notes.   HISTORY  Chief Complaint Abdominal Pain    HPI Gina Doyle is a 45 y.o. female presents emergency department with epigastric type pain.  Patient states she has a lot of burning and pain.  States it wakes her up at night.  She is having to roll over and apply a warm compress to try and help with pain.  Patient has history of anemia.  She took 2 days of naproxen" taking the medication.  Some nausea/vomiting.  Has been vomiting bile for 4 days.  Denies diarrhea.  Denies UTI symptoms.    Past Medical History:  Diagnosis Date  . CKD (chronic kidney disease)   . Hypertension     There are no problems to display for this patient.   History reviewed. No pertinent surgical history.  Prior to Admission medications   Medication Sig Start Date End Date Taking? Authorizing Provider  pantoprazole (PROTONIX) 20 MG tablet Take 1 tablet (20 mg total) by mouth daily. 08/13/20 08/13/21 Yes Damonta Cossey, Linden Dolin, PA-C  sucralfate (CARAFATE) 1 g tablet Take 1 tablet (1 g total) by mouth 4 (four) times daily. 08/13/20 08/13/21 Yes Roderic Lammert, Linden Dolin, PA-C  atenolol (TENORMIN) 50 MG tablet Take 50 mg by mouth daily.    [provider]  atenolol (TENORMIN) 50 MG tablet Take 1 tablet (50 mg total) by mouth daily. 01/10/19 04/10/19  Menshew, Dannielle Karvonen, PA-C  orphenadrine (NORFLEX) 100 MG tablet Take 1 tablet (100 mg total) by mouth 2 (two) times daily. 07/28/20   Sable Feil, PA-C  metoCLOPramide (REGLAN) 10 MG tablet Take 1 tablet (10 mg total) by mouth every 8 (eight) hours as needed for up to 5 days for nausea (or headache). 11/27/17 01/10/19  Arta Silence, MD  omeprazole (PRILOSEC OTC) 20 MG tablet Take 20 mg by mouth  daily.  01/10/19  [provider]  omeprazole (PRILOSEC) 40 MG capsule Take 1 capsule (40 mg total) by mouth daily. 01/10/19 08/13/20  Menshew, Dannielle Karvonen, PA-C    Allergies Patient has no known allergies.  No family history on file.  Social History Social History   Tobacco Use  . Smoking status: Current Every Day Smoker    Packs/day: 1.00    Types: Cigarettes  . Smokeless tobacco: Never Used  Substance Use Topics  . Alcohol use: Not Currently  . Drug use: Not Currently    Review of Systems  Constitutional: No fever/chills Eyes: No visual changes. ENT: No sore throat. Respiratory: Denies cough Cardiovascular: Denies chest pain Gastrointestinal: Positive abdominal pain Genitourinary: Negative for dysuria. Musculoskeletal: Negative for back pain. Skin: Negative for rash. Psychiatric: no mood changes,     ____________________________________________   PHYSICAL EXAM:  VITAL SIGNS: ED Triage Vitals  Enc Vitals Group     BP 08/13/20 1258 (S) (!) 183/100     Pulse Rate 08/13/20 1258 60     Resp 08/13/20 1258 19     Temp 08/13/20 1258 98.3 F (36.8 C)     Temp Source 08/13/20 1259 Oral     SpO2 08/13/20 1258 100 %     Weight --      Height --      Head Circumference --      Peak Flow --  Pain Score 08/13/20 1256 10     Pain Loc --      Pain Edu? --      Excl. in [place]? --     Constitutional: Alert and oriented. Well appearing and in no acute distress. Eyes: Conjunctivae are normal.  Head: Atraumatic. Nose: No congestion/rhinnorhea. Mouth/Throat: Mucous membranes are moist.   Neck:  supple no lymphadenopathy noted Cardiovascular: Normal rate, regular rhythm. Heart sounds are normal Respiratory: Normal respiratory effort.  No retractions, lungs c t a  Abd: soft tender in the epigastric and right upper quadrant, bs normal all 4 quad GU: deferred Musculoskeletal: FROM all extremities, warm and well perfused Neurologic:  Normal speech and  language.  Skin:  Skin is warm, dry and intact. No rash noted. Psychiatric: Mood and affect are normal. Speech and behavior are normal.  ____________________________________________   LABS (all labs ordered are listed, but only abnormal results are displayed)  Labs Reviewed  COMPREHENSIVE METABOLIC PANEL - Abnormal; Notable for the following components:      Result Value   CO2 20 (*)    Creatinine, Ser 2.29 (*)    Calcium 10.4 (*)    Albumin 3.4 (*)    GFR, Estimated 26 (*)    All other components within normal limits  CBC - Abnormal; Notable for the following components:   Hemoglobin 8.9 (*)    HCT 29.6 (*)    MCV 71.5 (*)    MCH 21.5 (*)    RDW 20.4 (*)    Platelets 448 (*)    All other components within normal limits  URINALYSIS, COMPLETE (UACMP) WITH MICROSCOPIC - Abnormal; Notable for the following components:   Color, Urine YELLOW (*)    APPearance HAZY (*)    Protein, ur 100 (*)    Leukocytes,Ua MODERATE (*)    All other components within normal limits  LIPASE, BLOOD  POC URINE PREG, ED   ____________________________________________   ____________________________________________  RADIOLOGY  CT abdomen/pelvis with contrast due to shortage  ____________________________________________   PROCEDURES  Procedure(s) performed: No  Procedures    ____________________________________________   INITIAL IMPRESSION / ASSESSMENT AND PLAN / ED COURSE  Pertinent labs & imaging results that were available during my care of the patient were reviewed by me and considered in my medical decision making (see chart for details).   Patient is a 45 year old female presents with epigastric/right upper quadrant pain.  See HPI.  Physical exam shows patient appears stable  DDx: PUD, bleeding peptic ulcer, acute cholecystitis, acute cholangitis, GERD, pancreatitis  Patient CBC has a decreased hemoglobin compared to the one 4 months ago.  4 months ago was 9.4 and she is at  8.9 now.  Elevates concern for bleeding ulcer  Comprehensive metabolic panel is stable when compared to the patient's old lab work.  Urinalysis has moderate leuks.  POC pregnancy is negative, lipase normal   CT abdomen/pelvis without contrast show any gallstones or stable bleeding peptic ulcer. CT reviewed by me and confirmed by radiology  Patient is given prescriptions for sucralfate and Protonix.  She is to follow-up with GI.  Return emergency department worsening.  She is discharged stable condition.    Gina Doyle was evaluated in Emergency Department on 08/13/2020 for the symptoms described in the history of present illness. She was evaluated in the context of the global COVID-19 pandemic, which necessitated consideration that the patient might be at risk for infection with the SARS-CoV-2 virus that causes COVID-19. Institutional protocols and algorithms  that pertain to the evaluation of patients at risk for COVID-19 are in a state of rapid change based on information released by regulatory bodies including the CDC and federal and state organizations. These policies and algorithms were followed during the patient's care in the ED.    As part of my medical decision making, I reviewed the following data within the [place] notes reviewed and incorporated, Labs reviewed , Old chart reviewed, Radiograph reviewed , Notes from prior ED visits and [place] Controlled Substance Database  ____________________________________________   FINAL CLINICAL IMPRESSION(S) / ED DIAGNOSES  Final diagnoses:  Peptic ulcer disease  Pain of upper abdomen      NEW MEDICATIONS STARTED DURING THIS VISIT:  New Prescriptions   PANTOPRAZOLE (PROTONIX) 20 MG TABLET    Take 1 tablet (20 mg total) by mouth daily.   SUCRALFATE (CARAFATE) 1 G TABLET    Take 1 tablet (1 g total) by mouth 4 (four) times daily.     Note:  This document was prepared using Dragon voice recognition software and  may include unintentional dictation errors.    Versie Starks, PA-C 08/13/20 1509    Harvest Dark, MD 08/15/20 [phone]

## 2020-08-13 NOTE — ED Notes (Addendum)
See triage note. Pt resting on side in bed with legs curled up. Pt states has hx GERD (about 9 years ago dx), but past 4 days has been feeling intense burning pain in stomach and throat and has not been able to eat. Has been vomiting bile for 4 days.  States that burning pain also extends into lower abdomen. Pt states it hurts to touch area on chest.  Pt states had to leave work today because pain was so bad, she was sweating profusely.   Denies diarrhea or constipation. Did have constipation for 1 month recently but it resolved with probiotic yogurt.

## 2020-08-13 NOTE — ED Notes (Signed)
Provider at bedside

## 2020-08-13 NOTE — ED Notes (Signed)
Pt to CT

## 2020-09-10 ENCOUNTER — Encounter: Payer: Self-pay | Admitting: *Deleted

## 2020-09-16 ENCOUNTER — Other Ambulatory Visit: Payer: Self-pay | Admitting: *Deleted

## 2020-09-16 ENCOUNTER — Encounter: Payer: Self-pay | Admitting: *Deleted

## 2020-09-17 ENCOUNTER — Encounter: Payer: Self-pay | Admitting: Internal Medicine

## 2020-09-17 ENCOUNTER — Inpatient Hospital Stay: Payer: Medicaid Other | Attending: Internal Medicine | Admitting: Internal Medicine

## 2020-09-17 ENCOUNTER — Inpatient Hospital Stay: Payer: Medicaid Other

## 2020-09-17 VITALS — BP 159/102 | HR 70 | Temp 98.1°F | Resp 20 | Wt 198.0 lb

## 2020-09-17 DIAGNOSIS — Z800 Family history of malignant neoplasm of digestive organs: Secondary | ICD-10-CM | POA: Insufficient documentation

## 2020-09-17 DIAGNOSIS — N920 Excessive and frequent menstruation with regular cycle: Secondary | ICD-10-CM

## 2020-09-17 DIAGNOSIS — Z809 Family history of malignant neoplasm, unspecified: Secondary | ICD-10-CM | POA: Insufficient documentation

## 2020-09-17 DIAGNOSIS — F17210 Nicotine dependence, cigarettes, uncomplicated: Secondary | ICD-10-CM

## 2020-09-17 DIAGNOSIS — R5383 Other fatigue: Secondary | ICD-10-CM

## 2020-09-17 DIAGNOSIS — D509 Iron deficiency anemia, unspecified: Secondary | ICD-10-CM | POA: Insufficient documentation

## 2020-09-17 DIAGNOSIS — E611 Iron deficiency: Secondary | ICD-10-CM | POA: Insufficient documentation

## 2020-09-17 DIAGNOSIS — I129 Hypertensive chronic kidney disease with stage 1 through stage 4 chronic kidney disease, or unspecified chronic kidney disease: Secondary | ICD-10-CM | POA: Insufficient documentation

## 2020-09-17 DIAGNOSIS — R109 Unspecified abdominal pain: Secondary | ICD-10-CM

## 2020-09-17 DIAGNOSIS — M549 Dorsalgia, unspecified: Secondary | ICD-10-CM

## 2020-09-17 DIAGNOSIS — N184 Chronic kidney disease, stage 4 (severe): Secondary | ICD-10-CM

## 2020-09-17 NOTE — Progress Notes (Signed)
[place] NOTE  Patient Care Team: Donnie Coffin, MD as PCP - General (Family Medicine)  CHIEF COMPLAINTS/PURPOSE OF CONSULTATION: ANEMIA   HEMATOLOGY HISTORY:  # ANEMIA [June 2022- Ferritin-3.5; Hb- 8.3; MCv-73]EGD-; colonoscopy- ; capsule;June 2022- CT A/P- Motion degraded.  No acute abnormality identified; Few small foci of lucency within the iliac bones. Indeterminate but without aggressive features. Could be related to CKD.   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    HISTORY OF PRESENTING ILLNESS:  Gina Doyle 45 y.o.  female with above history of chronic kidney disease -stage IV is currently referred to [place] for further evaluation/treatment for severe anemia.  Patient was recently evaluated by nephrology/[facility]-showed ferritin of 3.5 hemoglobin of 8.3.   Of note patient was recently evaluated in the emergency room for abdominal pain-suspected clinically for peptic ulcer disease.  Patient been started on sucralfate.  Noticed to have some improvement of the pain.  However she is awaiting evaluation with GI/referral by nephrology  Blood in stools: None Change in bowel habits- None Blood in urine: None Difficulty swallowing: None Abnormal weight loss: None Iron supplementation: PO Iron - few months? compliance Prior Blood transfusions: none Bariatric surgery: None EGD/Colonoscopy:none [awaiting GI EGD/ re: stomach ulcers] Vaginal bleeding: previous heavy [3-4 months]  Review of Systems  Constitutional:  Positive for malaise/fatigue. Negative for chills, diaphoresis, fever and weight loss.  HENT:  Negative for nosebleeds and sore throat.   Eyes:  Negative for double vision.  Respiratory:  Negative for cough, hemoptysis, sputum production, shortness of breath and wheezing.   Cardiovascular:  Negative for chest pain, palpitations, orthopnea and leg swelling.  Gastrointestinal:  Positive for abdominal pain.  Negative for blood in stool, constipation, diarrhea, heartburn, melena, nausea and vomiting.  Genitourinary:  Negative for dysuria, frequency and urgency.  Musculoskeletal:  Positive for back pain. Negative for joint pain.  Skin: Negative.  Negative for itching and rash.  Neurological:  Negative for dizziness, tingling, focal weakness, weakness and headaches.  Endo/Heme/Allergies:  Does not bruise/bleed easily.  Psychiatric/Behavioral:  Negative for depression. The patient is not nervous/anxious and does not have insomnia.    MEDICAL HISTORY:  Past Medical History:  Diagnosis Date   CKD (chronic kidney disease)    GERD (gastroesophageal reflux disease)    History of COVID-19 03/2020   Hypertension    IDA (iron deficiency anemia)    Peptic ulcer disease     SURGICAL HISTORY: History reviewed. No pertinent surgical history.  SOCIAL HISTORY: Social History   Socioeconomic History   Marital status: Single    Spouse name: Not on file   Number of children: Not on file   Years of education: Not on file   Highest education level: Not on file  Occupational History   Not on file  Tobacco Use   Smoking status: Every Day    Packs/day: 1.00    Pack years: 0.00    Types: Cigarettes   Smokeless tobacco: Never  Substance and Sexual Activity   Alcohol use: Not Currently   Drug use: Not Currently   Sexual activity: Not on file  Other Topics Concern   Not on file  Social History Narrative   Lives in [place] with son. Smoke 5- cig/day; No alcohol; works at [employer].    Social Determinants of Health   Financial Resource Strain: Not on file  Food Insecurity: Not on file  Transportation Needs: Not on file  Physical Activity:  Not on file  Stress: Not on file  Social Connections: Not on file  Intimate Partner Violence: Not on file    FAMILY HISTORY: Family History  Problem Relation Age of Onset   Cancer Maternal Grandfather    Cancer Paternal Grandmother    Pancreatic cancer  Paternal Grandmother     ALLERGIES:  has No Known Allergies.  MEDICATIONS:  Current Outpatient Medications  Medication Sig Dispense Refill   atenolol (TENORMIN) 50 MG tablet Take 50 mg by mouth daily.     orphenadrine (NORFLEX) 100 MG tablet Take 1 tablet (100 mg total) by mouth 2 (two) times daily. 10 tablet 0   pantoprazole (PROTONIX) 20 MG tablet Take 1 tablet (20 mg total) by mouth daily. 30 tablet 1   sucralfate (CARAFATE) 1 g tablet Take 1 tablet (1 g total) by mouth 4 (four) times daily. 120 tablet 1   No current facility-administered medications for this visit.      PHYSICAL EXAMINATION:   Vitals:   09/17/20 1130 09/17/20 1143  BP: (!) 170/103 (!) 159/102  Pulse: 70   Resp: 20   Temp: 98.1 F (36.7 C)   SpO2: 99%    Filed Weights   09/17/20 1130  Weight: 198 lb (89.8 kg)    Physical Exam Vitals and nursing note reviewed.  Constitutional:      Comments: Ambulating: independently;   Alone  HENT:     Head: Normocephalic and atraumatic.     Mouth/Throat:     Pharynx: Oropharynx is clear.  Eyes:     Extraocular Movements: Extraocular movements intact.     Pupils: Pupils are equal, round, and reactive to light.  Cardiovascular:     Rate and Rhythm: Normal rate and regular rhythm.  Pulmonary:     Comments: Decreased breath sounds bilaterally.  Abdominal:     Palpations: Abdomen is soft.  Musculoskeletal:        General: Normal range of motion.     Cervical back: Normal range of motion.  Skin:    General: Skin is warm.  Neurological:     General: No focal deficit present.     Mental Status: She is alert and oriented to person, place, and time.  Psychiatric:        Behavior: Behavior normal.        Judgment: Judgment normal.    LABORATORY DATA:  I have reviewed the data as listed Lab Results  Component Value Date   WBC 7.2 08/13/2020   HGB 8.9 (L) 08/13/2020   HCT 29.6 (L) 08/13/2020   MCV 71.5 (L) 08/13/2020   PLT 448 (H) 08/13/2020    Recent Labs    04/04/20 1842 08/13/20 1258  NA 139 137  K 4.2 4.2  CL 109 109  CO2 20* 20*  GLUCOSE 116* 91  BUN 18 20  CREATININE 2.24* 2.29*  CALCIUM 9.8 10.4*  GFRNONAA 27* 26*  PROT  --  7.4  ALBUMIN  --  3.4*  AST  --  22  ALT  --  15  ALKPHOS  --  73  BILITOT  --  0.6     No results found.  Microcytic anemia #Iron deficient anemia [ferritin 3.5/hemoglobin 8.2-June 2022; [facility] nephrology].  Patient symptomatic.  Recommend IV iron infusion. Discussed the potential acute infusion reactions with IV iron; which are quite rare.  Patient understands the risk; will proceed with infusions.  Discussed that she might need maintenance iron infusions based upon the cause of her iron  deficiency/and also based on response to therapy.  #Etiology: Multifactorial-CKD stage IV/recent history of menorrhagia/ ?  Peptic ulcer disease; agree with GI evaluation.  #Abdominal pain: On sucralfate/Protonix-improved not resolved.  Await above work-up  #Elevated blood pressure-159/102; again recommend compliance with her blood pressure medications; close follow-up with nephrology.  #Active smoker: Discussed at length with the patient regarding the need to quitting smoking-as it could lead to significant morbidities-including but not limited to worsening peptic ulcer disease/chronic kidney disease/other vascular disease and malignancies.  Patient is motivated.  Thank you, Dr.Aycock  for allowing me to participate in the care of your pleasant patient. Please do not hesitate to contact me with questions or concerns in the interim.  # DISPOSITION: # IV venofer weekly x4 # follow up in second week of Sep- MD; labs- cbc/cmp;LDH; haptoglobin; retic count-Dr.B #   All questions were answered. The patient knows to call the clinic with any problems, questions or concerns.    Cammie Sickle, MD 09/17/2020 12:22 PM

## 2020-09-17 NOTE — Assessment & Plan Note (Addendum)
#  Iron deficient anemia [ferritin 3.5/hemoglobin 8.2-June 2022; [facility] nephrology].  Patient symptomatic.  Recommend IV iron infusion. Discussed the potential acute infusion reactions with IV iron; which are quite rare.  Patient understands the risk; will proceed with infusions.  Discussed that she might need maintenance iron infusions based upon the cause of her iron deficiency/and also based on response to therapy.  #Etiology: Multifactorial-CKD stage IV/recent history of menorrhagia/ ?  Peptic ulcer disease; agree with GI evaluation.  #Abdominal pain: On sucralfate/Protonix-improved not resolved.  Await above work-up  #Elevated blood pressure-159/102; again recommend compliance with her blood pressure medications; close follow-up with nephrology.  #Active smoker: Discussed at length with the patient regarding the need to quitting smoking-as it could lead to significant morbidities-including but not limited to worsening peptic ulcer disease/chronic kidney disease/other vascular disease and malignancies.  Patient is motivated.  Thank you, Dr.Aycock  for allowing me to participate in the care of your pleasant patient. Please do not hesitate to contact me with questions or concerns in the interim.  # DISPOSITION: # IV venofer weekly x4 # follow up in second week of Sep- MD; labs- cbc/cmp;LDH; haptoglobin; retic count-Dr.B #

## 2020-09-24 ENCOUNTER — Inpatient Hospital Stay: Payer: Medicaid Other

## 2020-09-24 VITALS — BP 145/91 | HR 57 | Temp 97.0°F | Resp 18

## 2020-09-24 DIAGNOSIS — D509 Iron deficiency anemia, unspecified: Secondary | ICD-10-CM

## 2020-09-24 MED ORDER — SODIUM CHLORIDE 0.9 % IV SOLN
Freq: Once | INTRAVENOUS | Status: AC
  Filled 2020-09-24: qty 250

## 2020-09-24 MED ORDER — SODIUM CHLORIDE 0.9 % IV SOLN: 200.0000 mg | Freq: Once | INTRAVENOUS | Status: DC

## 2020-09-24 MED ORDER — IRON SUCROSE 20 MG/ML IV SOLN
200.0000 mg | Freq: Once | INTRAVENOUS | Status: AC
  Administered 2020-09-24: 200 mg via INTRAVENOUS
  Filled 2020-09-24: qty 10

## 2020-09-24 NOTE — Patient Instructions (Signed)
[facility]  Discharge Instructions: Thank you for choosing [facility] to provide your oncology and hematology care.  If you have a lab appointment with the Cancer Center, please go directly to the Cancer Center and check in at the registration area.  Wear comfortable clothing and clothing appropriate for easy access to any Portacath or PICC line.   We strive to give you quality time with your provider. You may need to reschedule your appointment if you arrive late (15 or more minutes).  Arriving late affects you and other patients whose appointments are after yours.  Also, if you miss three or more appointments without notifying the office, you may be dismissed from the clinic at the provider's discretion.      For prescription refill requests, have your pharmacy contact our office and allow 72 hours for refills to be completed.    Today you received the following chemotherapy and/or immunotherapy agents VENOFER      To help prevent nausea and vomiting after your treatment, we encourage you to take your nausea medication as directed.  BELOW ARE SYMPTOMS THAT SHOULD BE REPORTED IMMEDIATELY: *FEVER GREATER THAN 100.4 F (38 C) OR HIGHER *CHILLS OR SWEATING *NAUSEA AND VOMITING THAT IS NOT CONTROLLED WITH YOUR NAUSEA MEDICATION *UNUSUAL SHORTNESS OF BREATH *UNUSUAL BRUISING OR BLEEDING *URINARY PROBLEMS (pain or burning when urinating, or frequent urination) *BOWEL PROBLEMS (unusual diarrhea, constipation, pain near the anus) TENDERNESS IN MOUTH AND THROAT WITH OR WITHOUT PRESENCE OF ULCERS (sore throat, sores in mouth, or a toothache) UNUSUAL RASH, SWELLING OR PAIN  UNUSUAL VAGINAL DISCHARGE OR ITCHING   Items with * indicate a potential emergency and should be followed up as soon as possible or go to the Emergency Department if any problems should occur.  Please show the CHEMOTHERAPY ALERT CARD or IMMUNOTHERAPY ALERT CARD at check-in to  the Emergency Department and triage nurse.  Should you have questions after your visit or need to cancel or reschedule your appointment, please contact [facility]CER [facility]  [phone] and follow the prompts.  Office hours are 8:00 a.m. to 4:30 p.m. Monday - Friday. Please note that voicemails left after 4:00 p.m. may not be returned until the following business day.  We are closed weekends and major holidays. You have access to a nurse at all times for urgent questions. Please call the main number to the clinic [phone] and follow the prompts.  For any non-urgent questions, you may also contact your provider using MyChart. We now offer e-Visits for anyone 18 and older to request care online for non-urgent symptoms. For details visit [email or web address].   Also download the MyChart app! Go to the app store, search "MyChart", open the app, select [facility], and log in with your MyChart username and password.  Due to Covid, a mask is required upon entering the hospital/clinic. If you do not have a mask, one will be given to you upon arrival. For doctor visits, patients may have 1 support person aged 18 or older with them. For treatment visits, patients cannot have anyone with them due to current Covid guidelines and our immunocompromised population.   Iron Sucrose injection What is this medication? IRON SUCROSE (AHY ern SOO krohs) is an iron complex. Iron is used to make healthy red blood cells, which carry oxygen and nutrients throughout the body. This medicine is used to treat iron deficiency anemia in people with chronickidney disease. This medicine may be used for other   purposes; ask your health care provider orpharmacist if you have questions. COMMON BRAND NAME(S): Venofer What should I tell my care team before I take this medication? They need to know if you have any of these conditions: anemia not caused by low iron levels heart disease high levels of  iron in the blood kidney disease liver disease an unusual or allergic reaction to iron, other medicines, foods, dyes, or preservatives pregnant or trying to get pregnant breast-feeding How should I use this medication? This medicine is for infusion into a vein. It is given by a health careprofessional in a hospital or clinic setting. Talk to your pediatrician regarding the use of this medicine in children. While this drug may be prescribed for children as young as 2 years for selectedconditions, precautions do apply. Overdosage: If you think you have taken too much of this medicine contact apoison control center or emergency room at once. NOTE: This medicine is only for you. Do not share this medicine with others. What if I miss a dose? It is important not to miss your dose. Call your doctor or health careprofessional if you are unable to keep an appointment. What may interact with this medication? Do not take this medicine with any of the following medications: deferoxamine dimercaprol other iron products This medicine may also interact with the following medications: chloramphenicol deferasirox This list may not describe all possible interactions. Give your health care provider a list of all the medicines, herbs, non-prescription drugs, or dietary supplements you use. Also tell them if you smoke, drink alcohol, or use illegaldrugs. Some items may interact with your medicine. What should I watch for while using this medication? Visit your doctor or healthcare professional regularly. Tell your doctor or healthcare professional if your symptoms do not start to get better or if theyget worse. You may need blood work done while you are taking this medicine. You may need to follow a special diet. Talk to your doctor. Foods that contain iron include: whole grains/cereals, dried fruits, beans, or peas, leafy greenvegetables, and organ meats (liver, kidney). What side effects may I notice from  receiving this medication? Side effects that you should report to your doctor or health care professionalas soon as possible: allergic reactions like skin rash, itching or hives, swelling of the face, lips, or tongue breathing problems changes in blood pressure cough fast, irregular heartbeat feeling faint or lightheaded, falls fever or chills flushing, sweating, or hot feelings joint or muscle aches/pains seizures swelling of the ankles or feet unusually weak or tired Side effects that usually do not require medical attention (report to yourdoctor or health care professional if they continue or are bothersome): diarrhea feeling achy headache irritation at site where injected nausea, vomiting stomach upset tiredness This list may not describe all possible side effects. Call your doctor for medical advice about side effects. You may report side effects to FDA at1-800-FDA-1088. Where should I keep my medication? This drug is given in a hospital or clinic and will not be stored at home. NOTE: This sheet is a summary. It may not cover all possible information. If you have questions about this medicine, talk to your doctor, pharmacist, orhealth care provider.  2022 Elsevier/Gold Standard (2010-12-10 17:14:35)  

## 2020-10-01 ENCOUNTER — Inpatient Hospital Stay: Payer: Medicaid Other

## 2020-10-01 VITALS — BP 133/103 | HR 51 | Temp 96.7°F | Resp 20

## 2020-10-01 DIAGNOSIS — D509 Iron deficiency anemia, unspecified: Secondary | ICD-10-CM

## 2020-10-01 MED ORDER — SODIUM CHLORIDE 0.9 % IV SOLN
Freq: Once | INTRAVENOUS | Status: AC
  Filled 2020-10-01: qty 250

## 2020-10-01 MED ORDER — IRON SUCROSE 20 MG/ML IV SOLN
200.0000 mg | Freq: Once | INTRAVENOUS | Status: AC
Start: 2020-10-01 — End: 2020-10-01
  Administered 2020-10-01: 200 mg via INTRAVENOUS
  Filled 2020-10-01: qty 10

## 2020-10-01 MED ORDER — SODIUM CHLORIDE 0.9 % IV SOLN: 200.0000 mg | Freq: Once | INTRAVENOUS | Status: DC

## 2020-10-01 NOTE — Patient Instructions (Signed)

## 2020-10-02 NOTE — Progress Notes (Signed)
..  The following Medication: Venofer is approved for drug replacement program by [employer]. The enrollment period is from 10/02/2020 to 10/03/2021.  Reason for Assistance: Self Pay. ID: [id number] First DOS:09/24/2020.

## 2020-10-08 ENCOUNTER — Inpatient Hospital Stay: Payer: Medicaid Other

## 2020-10-08 VITALS — BP 148/98 | HR 62

## 2020-10-08 DIAGNOSIS — D509 Iron deficiency anemia, unspecified: Secondary | ICD-10-CM

## 2020-10-08 MED ORDER — SODIUM CHLORIDE 0.9 % IV SOLN
Freq: Once | INTRAVENOUS | Status: AC
  Filled 2020-10-08: qty 250

## 2020-10-08 MED ORDER — IRON SUCROSE 20 MG/ML IV SOLN
200.0000 mg | Freq: Once | INTRAVENOUS | Status: AC
  Administered 2020-10-08: 200 mg via INTRAVENOUS
  Filled 2020-10-08: qty 10

## 2020-10-08 MED ORDER — SODIUM CHLORIDE 0.9 % IV SOLN: 200.0000 mg | Freq: Once | INTRAVENOUS | Status: DC

## 2020-10-08 NOTE — Patient Instructions (Signed)
[facility]  Discharge Instructions: Thank you for choosing [facility] to provide your oncology and hematology care.  If you have a lab appointment with the Cancer Center, please go directly to the Cancer Center and check in at the registration area.  Wear comfortable clothing and clothing appropriate for easy access to any Portacath or PICC line.   We strive to give you quality time with your provider. You may need to reschedule your appointment if you arrive late (15 or more minutes).  Arriving late affects you and other patients whose appointments are after yours.  Also, if you miss three or more appointments without notifying the office, you may be dismissed from the clinic at the provider's discretion.      For prescription refill requests, have your pharmacy contact our office and allow 72 hours for refills to be completed.    Today you received the following : Venofer   To help prevent nausea and vomiting after your treatment, we encourage you to take your nausea medication as directed.  BELOW ARE SYMPTOMS THAT SHOULD BE REPORTED IMMEDIATELY: . *FEVER GREATER THAN 100.4 F (38 C) OR HIGHER . *CHILLS OR SWEATING . *NAUSEA AND VOMITING THAT IS NOT CONTROLLED WITH YOUR NAUSEA MEDICATION . *UNUSUAL SHORTNESS OF BREATH . *UNUSUAL BRUISING OR BLEEDING . *URINARY PROBLEMS (pain or burning when urinating, or frequent urination) . *BOWEL PROBLEMS (unusual diarrhea, constipation, pain near the anus) . TENDERNESS IN MOUTH AND THROAT WITH OR WITHOUT PRESENCE OF ULCERS (sore throat, sores in mouth, or a toothache) . UNUSUAL RASH, SWELLING OR PAIN  . UNUSUAL VAGINAL DISCHARGE OR ITCHING   Items with * indicate a potential emergency and should be followed up as soon as possible or go to the Emergency Department if any problems should occur.  Please show the CHEMOTHERAPY ALERT CARD or IMMUNOTHERAPY ALERT CARD at check-in to the Emergency  Department and triage nurse.  Should you have questions after your visit or need to cancel or reschedule your appointment, please contact [facility]  [phone] and follow the prompts.  Office hours are 8:00 a.m. to 4:30 p.m. Monday - Friday. Please note that voicemails left after 4:00 p.m. may not be returned until the following business day.  We are closed weekends and major holidays. You have access to a nurse at all times for urgent questions. Please call the main number to the clinic [phone] and follow the prompts.  For any non-urgent questions, you may also contact your provider using MyChart. We now offer e-Visits for anyone 18 and older to request care online for non-urgent symptoms. For details visit [email or web address].   Also download the MyChart app! Go to the app store, search "MyChart", open the app, select , and log in with your MyChart username and password.  Due to Covid, a mask is required upon entering the hospital/clinic. If you do not have a mask, one will be given to you upon arrival. For doctor visits, patients may have 1 support person aged 18 or older with them. For treatment visits, patients cannot have anyone with them due to current Covid guidelines and our immunocompromised population.  

## 2020-10-15 ENCOUNTER — Inpatient Hospital Stay: Payer: Medicaid Other

## 2020-10-16 ENCOUNTER — Other Ambulatory Visit: Payer: Self-pay

## 2020-10-16 ENCOUNTER — Inpatient Hospital Stay: Payer: Medicaid Other | Attending: Internal Medicine

## 2020-10-16 VITALS — BP 151/87 | HR 48 | Temp 96.0°F | Resp 18

## 2020-10-16 DIAGNOSIS — D509 Iron deficiency anemia, unspecified: Secondary | ICD-10-CM | POA: Insufficient documentation

## 2020-10-16 MED ORDER — SODIUM CHLORIDE 0.9 % IV SOLN: 200.0000 mg | Freq: Once | INTRAVENOUS | Status: DC

## 2020-10-16 MED ORDER — SODIUM CHLORIDE 0.9 % IV SOLN
Freq: Once | INTRAVENOUS | Status: AC
  Filled 2020-10-16: qty 250

## 2020-10-16 MED ORDER — IRON SUCROSE 20 MG/ML IV SOLN
200.0000 mg | Freq: Once | INTRAVENOUS | Status: AC
  Administered 2020-10-16: 200 mg via INTRAVENOUS
  Filled 2020-10-16: qty 10

## 2020-10-16 NOTE — Progress Notes (Signed)
HR 47, Manual. Per patient this is typical for her. Patient deniers any s/s.

## 2020-10-16 NOTE — Patient Instructions (Signed)

## 2020-11-19 ENCOUNTER — Inpatient Hospital Stay: Payer: Medicaid Other

## 2020-11-19 ENCOUNTER — Inpatient Hospital Stay: Payer: Medicaid Other | Admitting: Internal Medicine

## 2020-12-10 ENCOUNTER — Encounter: Payer: Self-pay | Admitting: Internal Medicine

## 2020-12-10 ENCOUNTER — Inpatient Hospital Stay: Payer: Medicaid Other

## 2020-12-10 ENCOUNTER — Inpatient Hospital Stay: Payer: Medicaid Other | Attending: Internal Medicine | Admitting: Internal Medicine

## 2020-12-10 DIAGNOSIS — Z800 Family history of malignant neoplasm of digestive organs: Secondary | ICD-10-CM | POA: Insufficient documentation

## 2020-12-10 DIAGNOSIS — D509 Iron deficiency anemia, unspecified: Secondary | ICD-10-CM

## 2020-12-10 DIAGNOSIS — R109 Unspecified abdominal pain: Secondary | ICD-10-CM | POA: Insufficient documentation

## 2020-12-10 DIAGNOSIS — R062 Wheezing: Secondary | ICD-10-CM | POA: Insufficient documentation

## 2020-12-10 DIAGNOSIS — N184 Chronic kidney disease, stage 4 (severe): Secondary | ICD-10-CM | POA: Insufficient documentation

## 2020-12-10 DIAGNOSIS — R112 Nausea with vomiting, unspecified: Secondary | ICD-10-CM | POA: Insufficient documentation

## 2020-12-10 DIAGNOSIS — Z809 Family history of malignant neoplasm, unspecified: Secondary | ICD-10-CM | POA: Insufficient documentation

## 2020-12-10 DIAGNOSIS — E611 Iron deficiency: Secondary | ICD-10-CM

## 2020-12-10 DIAGNOSIS — F17210 Nicotine dependence, cigarettes, uncomplicated: Secondary | ICD-10-CM | POA: Insufficient documentation

## 2020-12-10 DIAGNOSIS — I129 Hypertensive chronic kidney disease with stage 1 through stage 4 chronic kidney disease, or unspecified chronic kidney disease: Secondary | ICD-10-CM | POA: Insufficient documentation

## 2020-12-10 LAB — CBC WITH DIFFERENTIAL/PLATELET
Abs Immature Granulocytes: 0.04 10*3/uL (ref 0.00–0.07)
Basophils Absolute: 0 10*3/uL (ref 0.0–0.1)
Basophils Relative: 1 %
Eosinophils Absolute: 0.2 10*3/uL (ref 0.0–0.5)
Eosinophils Relative: 6 %
HCT: 37 % (ref 36.0–46.0)
Hemoglobin: 11.8 g/dL — ABNORMAL LOW (ref 12.0–15.0)
Immature Granulocytes: 1 %
Lymphocytes Relative: 33 %
Lymphs Abs: 1.4 10*3/uL (ref 0.7–4.0)
MCH: 27.2 pg (ref 26.0–34.0)
MCHC: 31.9 g/dL (ref 30.0–36.0)
MCV: 85.3 fL (ref 80.0–100.0)
Monocytes Absolute: 0.3 10*3/uL (ref 0.1–1.0)
Monocytes Relative: 7 %
Neutro Abs: 2.3 10*3/uL (ref 1.7–7.7)
Neutrophils Relative %: 52 %
Platelets: 296 10*3/uL (ref 150–400)
RBC: 4.34 MIL/uL (ref 3.87–5.11)
RDW: 22.3 % — ABNORMAL HIGH (ref 11.5–15.5)
WBC: 4.3 10*3/uL (ref 4.0–10.5)
nRBC: 0 % (ref 0.0–0.2)

## 2020-12-10 LAB — COMPREHENSIVE METABOLIC PANEL
ALT: 16 U/L (ref 0–44)
AST: 20 U/L (ref 15–41)
Albumin: 3.4 g/dL — ABNORMAL LOW (ref 3.5–5.0)
Alkaline Phosphatase: 82 U/L (ref 38–126)
Anion gap: 4 — ABNORMAL LOW (ref 5–15)
BUN: 15 mg/dL (ref 6–20)
CO2: 23 mmol/L (ref 22–32)
Calcium: 10 mg/dL (ref 8.9–10.3)
Chloride: 110 mmol/L (ref 98–111)
Creatinine, Ser: 2.2 mg/dL — ABNORMAL HIGH (ref 0.44–1.00)
GFR, Estimated: 27 mL/min — ABNORMAL LOW (ref >60)
Glucose, Bld: 103 mg/dL — ABNORMAL HIGH (ref 70–99)
Potassium: 4 mmol/L (ref 3.5–5.1)
Sodium: 137 mmol/L (ref 135–145)
Total Bilirubin: 0.6 mg/dL (ref 0.3–1.2)
Total Protein: 7.3 g/dL (ref 6.5–8.1)

## 2020-12-10 LAB — RETICULOCYTES
Immature Retic Fract: 16.8 % — ABNORMAL HIGH (ref 2.3–15.9)
RBC.: 4.4 MIL/uL (ref 3.87–5.11)
Retic Count, Absolute: 42.2 10*3/uL (ref 19.0–186.0)
Retic Ct Pct: 1 % (ref 0.4–3.1)

## 2020-12-10 LAB — LACTATE DEHYDROGENASE: LDH: 114 U/L (ref 98–192)

## 2020-12-10 MED ORDER — METHYLPREDNISOLONE 4 MG PO TBPK: ORAL_TABLET | ORAL | 1 refills | Status: DC

## 2020-12-10 NOTE — Patient Instructions (Signed)
#   checking blood pressures at home frequently.keep a log of blood pressures/and bring it to PCPs/neprhology attention.

## 2020-12-10 NOTE — Assessment & Plan Note (Addendum)
#  Iron deficient anemia [ferritin 3.5/hemoglobin 8.2-June 2022; [facility]]. S/p IV iron infusion- improved Hb 11.8; HOLD iron infusion.  Recommend starting p.o. iron pills as prescribed by nephrology.  #Etiology: Multifactorial-CKD stage IV/recent history of menorrhagia/ ?  Peptic ulcer disease; agree with GI evaluation.  # Bronchitis/wheezing: covid test- NEG; recommend medrol dose pack.    #Abdominal pain/nausea: On sucralfate/Protonix-improved not resolved.   #Elevated blood pressure-195/111- at home 1501-140s at home. again recommend compliance with her blood pressure medications; close follow-up with nephrology.  #Active smoker:counselled to quit smoking.   # DISPOSITION: # HOLD venofer today # follow up in 4 months MD; labs- cbc/bmp; Iron studies/ferritin; possible venfoer--Dr.B

## 2020-12-10 NOTE — Progress Notes (Signed)
Patient here for oncology follow-up appointment, concerns of nausea and decreased appetite

## 2020-12-10 NOTE — Progress Notes (Signed)
[facility] NOTE  Patient Care Team: Donnie Coffin, MD as PCP - General (Family Medicine)  CHIEF COMPLAINTS/PURPOSE OF CONSULTATION: ANEMIA   HEMATOLOGY HISTORY:  # ANEMIA [June 2022- Ferritin-3.5; Hb- 8.3; MCv-73] EGD/Colonoscopy:none [awaiting GI EGD/ re: stomach ulcers]; Vaginal bleeding: previous heavy [3-4 months]- June 2022- CT A/P- Motion degraded.  No acute abnormality identified; Few small foci of lucency within the iliac bones. Indeterminate but without aggressive features. Could be related to CKD.   # CKD stage IV [[place] ~2010]/Proteinuria [No Kidney Bx]; Poorly controlled HTN; Hx of peptic ulcer [awaiting GI]   HISTORY OF PRESENTING ILLNESS:  Gina Doyle 45 y.o.  female with above history of chronic kidney disease -stage IV/ severe anemia s/p IV venofer is here for a follow up.  Recently under the weather-""nasal congestion"/she has been wheezing.  Unfortunately continues smoke.  Losing weight loss- abnormal  lost 20 pounds in ? 5-6 months. Menstrual cycles improving.   Review of Systems  Constitutional:  Positive for malaise/fatigue. Negative for chills, diaphoresis, fever and weight loss.  HENT:  Negative for nosebleeds and sore throat.   Eyes:  Negative for double vision.  Respiratory:  Negative for cough, hemoptysis, sputum production, shortness of breath and wheezing.   Cardiovascular:  Negative for chest pain, palpitations, orthopnea and leg swelling.  Gastrointestinal:  Positive for abdominal pain. Negative for blood in stool, constipation, diarrhea, heartburn, melena, nausea and vomiting.  Genitourinary:  Negative for dysuria, frequency and urgency.  Musculoskeletal:  Positive for back pain. Negative for joint pain.  Skin: Negative.  Negative for itching and rash.  Neurological:  Negative for dizziness, tingling, focal weakness, weakness and headaches.  Endo/Heme/Allergies:  Does not bruise/bleed easily.  Psychiatric/Behavioral:   Negative for depression. The patient is not nervous/anxious and does not have insomnia.    MEDICAL HISTORY:  Past Medical History:  Diagnosis Date   CKD (chronic kidney disease)    GERD (gastroesophageal reflux disease)    History of COVID-19 03/2020   Hypertension    IDA (iron deficiency anemia)    Peptic ulcer disease     SURGICAL HISTORY: History reviewed. No pertinent surgical history.  SOCIAL HISTORY: Social History   Socioeconomic History   Marital status: Single    Spouse name: Not on file   Number of children: Not on file   Years of education: Not on file   Highest education level: Not on file  Occupational History   Not on file  Tobacco Use   Smoking status: Every Day    Packs/day: 1.00    Types: Cigarettes   Smokeless tobacco: Never  Substance and Sexual Activity   Alcohol use: Not Currently   Drug use: Not Currently   Sexual activity: Not on file  Other Topics Concern   Not on file  Social History Narrative   Lives in [place] with son. Smoke 5- cig/day; No alcohol; works at [employer].    Social Determinants of Health   Financial Resource Strain: Not on file  Food Insecurity: Not on file  Transportation Needs: Not on file  Physical Activity: Not on file  Stress: Not on file  Social Connections: Not on file  Intimate Partner Violence: Not on file    FAMILY HISTORY: Family History  Problem Relation Age of Onset   Cancer Maternal Grandfather    Cancer Paternal Grandmother    Pancreatic cancer Paternal Grandmother     ALLERGIES:  has No Known Allergies.  MEDICATIONS:  Current Outpatient  Medications  Medication Sig Dispense Refill   atenolol (TENORMIN) 50 MG tablet Take 50 mg by mouth daily.     atorvastatin (LIPITOR) 20 MG tablet Take 1 tablet by mouth daily.     methylPREDNISolone (MEDROL DOSEPAK) 4 MG TBPK tablet Use as directed. 21 tablet 1   orphenadrine (NORFLEX) 100 MG tablet Take 1 tablet (100 mg total) by mouth 2 (two) times daily. 10  tablet 0   pantoprazole (PROTONIX) 20 MG tablet Take 1 tablet (20 mg total) by mouth daily. 30 tablet 1   sucralfate (CARAFATE) 1 g tablet Take 1 tablet (1 g total) by mouth 4 (four) times daily. 120 tablet 1   losartan (COZAAR) 100 MG tablet Take by mouth.     No current facility-administered medications for this visit.      PHYSICAL EXAMINATION:   Vitals:   12/10/20 1000 12/10/20 1006  BP: (!) 185/105 (!) 195/111  Pulse: 60 (!) 58  Resp: 17   Temp: (!) 97.3 F (36.3 C)   SpO2: 99%    Filed Weights   12/10/20 1000  Weight: 193 lb (87.5 kg)    Physical Exam Vitals and nursing note reviewed.  Constitutional:      Comments: Ambulating: independently;   Alone  HENT:     Head: Normocephalic and atraumatic.     Mouth/Throat:     Pharynx: Oropharynx is clear.  Eyes:     Extraocular Movements: Extraocular movements intact.     Pupils: Pupils are equal, round, and reactive to light.  Cardiovascular:     Rate and Rhythm: Normal rate and regular rhythm.  Pulmonary:     Comments: Decreased breath sounds bilaterally.  Abdominal:     Palpations: Abdomen is soft.  Musculoskeletal:        General: Normal range of motion.     Cervical back: Normal range of motion.  Skin:    General: Skin is warm.  Neurological:     General: No focal deficit present.     Mental Status: She is alert and oriented to person, place, and time.  Psychiatric:        Behavior: Behavior normal.        Judgment: Judgment normal.    LABORATORY DATA:  I have reviewed the data as listed Lab Results  Component Value Date   WBC 4.3 12/10/2020   HGB 11.8 (L) 12/10/2020   HCT 37.0 12/10/2020   MCV 85.3 12/10/2020   PLT 296 12/10/2020   Recent Labs    04/04/20 1842 08/13/20 1258 12/10/20 0942  NA 139 137 137  K 4.2 4.2 4.0  CL 109 109 110  CO2 20* 20* 23  GLUCOSE 116* 91 103*  BUN '18 20 15  '$ CREATININE 2.24* 2.29* 2.20*  CALCIUM 9.8 10.4* 10.0  GFRNONAA 27* 26* 27*  PROT  --  7.4 7.3   ALBUMIN  --  3.4* 3.4*  AST  --  22 20  ALT  --  15 16  ALKPHOS  --  73 82  BILITOT  --  0.6 0.6     No results found.  Iron deficiency #Iron deficient anemia [ferritin 3.5/hemoglobin 8.2-June 2022; [facility]]. S/p IV iron infusion- improved Hb 11.8; HOLD iron infusion.  Recommend starting p.o. iron pills as prescribed by nephrology.  #Etiology: Multifactorial-CKD stage IV/recent history of menorrhagia/ ?  Peptic ulcer disease; agree with GI evaluation.  # Bronchitis/wheezing: covid test- NEG; recommend medrol dose pack.    #Abdominal pain/nausea: On sucralfate/Protonix-improved not  resolved.   #Elevated blood pressure-195/111- at home 1501-140s at home. again recommend compliance with her blood pressure medications; close follow-up with nephrology.  #Active smoker:counselled to quit smoking.   # DISPOSITION: # HOLD venofer today # follow up in 4 months MD; labs- cbc/bmp; Iron studies/ferritin; possible venfoer--Dr.B  All questions were answered. The patient knows to call the clinic with any problems, questions or concerns.    Cammie Sickle, MD 12/10/2020 12:11 PM

## 2020-12-11 LAB — HAPTOGLOBIN: Haptoglobin: 175 mg/dL (ref 42–296)

## 2021-04-01 ENCOUNTER — Other Ambulatory Visit: Payer: Self-pay | Admitting: *Deleted

## 2021-04-01 DIAGNOSIS — E611 Iron deficiency: Secondary | ICD-10-CM

## 2021-04-10 ENCOUNTER — Inpatient Hospital Stay (HOSPITAL_BASED_OUTPATIENT_CLINIC_OR_DEPARTMENT_OTHER): Payer: Medicaid Other | Admitting: Internal Medicine

## 2021-04-10 ENCOUNTER — Inpatient Hospital Stay: Payer: Medicaid Other | Attending: Internal Medicine

## 2021-04-10 ENCOUNTER — Inpatient Hospital Stay: Payer: Medicaid Other

## 2021-04-10 ENCOUNTER — Encounter: Payer: Self-pay | Admitting: Internal Medicine

## 2021-04-10 ENCOUNTER — Other Ambulatory Visit: Payer: Self-pay

## 2021-04-10 DIAGNOSIS — F17210 Nicotine dependence, cigarettes, uncomplicated: Secondary | ICD-10-CM | POA: Insufficient documentation

## 2021-04-10 DIAGNOSIS — I129 Hypertensive chronic kidney disease with stage 1 through stage 4 chronic kidney disease, or unspecified chronic kidney disease: Secondary | ICD-10-CM | POA: Insufficient documentation

## 2021-04-10 DIAGNOSIS — Z79899 Other long term (current) drug therapy: Secondary | ICD-10-CM | POA: Insufficient documentation

## 2021-04-10 DIAGNOSIS — N184 Chronic kidney disease, stage 4 (severe): Secondary | ICD-10-CM | POA: Insufficient documentation

## 2021-04-10 DIAGNOSIS — E611 Iron deficiency: Secondary | ICD-10-CM | POA: Insufficient documentation

## 2021-04-10 LAB — CBC WITH DIFFERENTIAL/PLATELET
Abs Immature Granulocytes: 0.01 10*3/uL (ref 0.00–0.07)
Basophils Absolute: 0 10*3/uL (ref 0.0–0.1)
Basophils Relative: 0 %
Eosinophils Absolute: 0.3 10*3/uL (ref 0.0–0.5)
Eosinophils Relative: 7 %
HCT: 34.3 % — ABNORMAL LOW (ref 36.0–46.0)
Hemoglobin: 11.1 g/dL — ABNORMAL LOW (ref 12.0–15.0)
Immature Granulocytes: 0 %
Lymphocytes Relative: 28 %
Lymphs Abs: 1.4 10*3/uL (ref 0.7–4.0)
MCH: 28.8 pg (ref 26.0–34.0)
MCHC: 32.4 g/dL (ref 30.0–36.0)
MCV: 88.9 fL (ref 80.0–100.0)
Monocytes Absolute: 0.4 10*3/uL (ref 0.1–1.0)
Monocytes Relative: 7 %
Neutro Abs: 2.8 10*3/uL (ref 1.7–7.7)
Neutrophils Relative %: 58 %
Platelets: 329 10*3/uL (ref 150–400)
RBC: 3.86 MIL/uL — ABNORMAL LOW (ref 3.87–5.11)
RDW: 17.2 % — ABNORMAL HIGH (ref 11.5–15.5)
WBC: 4.9 10*3/uL (ref 4.0–10.5)
nRBC: 0 % (ref 0.0–0.2)

## 2021-04-10 LAB — IRON AND TIBC
Iron: 30 ug/dL (ref 28–170)
Saturation Ratios: 8 % — ABNORMAL LOW (ref 10.4–31.8)
TIBC: 363 ug/dL (ref 250–450)
UIBC: 333 ug/dL

## 2021-04-10 LAB — BASIC METABOLIC PANEL
Anion gap: 5 (ref 5–15)
BUN: 15 mg/dL (ref 6–20)
CO2: 20 mmol/L — ABNORMAL LOW (ref 22–32)
Calcium: 9.2 mg/dL (ref 8.9–10.3)
Chloride: 110 mmol/L (ref 98–111)
Creatinine, Ser: 1.94 mg/dL — ABNORMAL HIGH (ref 0.44–1.00)
GFR, Estimated: 32 mL/min — ABNORMAL LOW (ref >60)
Glucose, Bld: 113 mg/dL — ABNORMAL HIGH (ref 70–99)
Potassium: 3.8 mmol/L (ref 3.5–5.1)
Sodium: 135 mmol/L (ref 135–145)

## 2021-04-10 LAB — FERRITIN: Ferritin: 7 ng/mL — ABNORMAL LOW (ref 11–307)

## 2021-04-10 NOTE — Progress Notes (Signed)
[place] NOTE  Patient Care Team: Donnie Coffin, MD as PCP - General (Family Medicine)  CHIEF COMPLAINTS/PURPOSE OF CONSULTATION: ANEMIA   HEMATOLOGY HISTORY:  # ANEMIA [June 2022- Ferritin-3.5; Hb- 8.3; MCv-73] EGD/Colonoscopy:none [awaiting GI EGD/ re: stomach ulcers]; Vaginal bleeding: previous heavy [3-4 months]- June 2022- CT A/P- Motion degraded.  No acute abnormality identified; Few small foci of lucency within the iliac bones. Indeterminate but without aggressive features. Could be related to CKD.   # CKD stage IV [[place] ~2010]/Proteinuria [No Kidney Bx]; Poorly controlled HTN; Hx of peptic ulcer [awaiting GI]   HISTORY OF PRESENTING ILLNESS: Alone.  Walking independently. Gina Doyle 46 y.o.  female with above history of chronic kidney disease -stage IV/ severe anemia s/p IV venofer is here for a follow up.  Patient denies any blood in stools or black-colored stools.  No nausea no vomiting.  She is not on p.o. iron because of history of constipation.  Review of Systems  Constitutional:  Positive for malaise/fatigue. Negative for chills, diaphoresis, fever and weight loss.  HENT:  Negative for nosebleeds and sore throat.   Eyes:  Negative for double vision.  Respiratory:  Negative for cough, hemoptysis, sputum production, shortness of breath and wheezing.   Cardiovascular:  Negative for chest pain, palpitations, orthopnea and leg swelling.  Gastrointestinal:  Positive for abdominal pain. Negative for blood in stool, constipation, diarrhea, heartburn, melena, nausea and vomiting.  Genitourinary:  Negative for dysuria, frequency and urgency.  Musculoskeletal:  Positive for back pain. Negative for joint pain.  Skin: Negative.  Negative for itching and rash.  Neurological:  Negative for dizziness, tingling, focal weakness, weakness and headaches.  Endo/Heme/Allergies:  Does not bruise/bleed easily.  Psychiatric/Behavioral:  Negative for depression.  The patient is not nervous/anxious and does not have insomnia.    MEDICAL HISTORY:  Past Medical History:  Diagnosis Date   CKD (chronic kidney disease)    GERD (gastroesophageal reflux disease)    History of COVID-19 03/2020   Hypertension    IDA (iron deficiency anemia)    Peptic ulcer disease     SURGICAL HISTORY: History reviewed. No pertinent surgical history.  SOCIAL HISTORY: Social History   Socioeconomic History   Marital status: Single    Spouse name: Not on file   Number of children: Not on file   Years of education: Not on file   Highest education level: Not on file  Occupational History   Not on file  Tobacco Use   Smoking status: Every Day    Packs/day: 1.00    Types: Cigarettes   Smokeless tobacco: Never  Substance and Sexual Activity   Alcohol use: Not Currently   Drug use: Not Currently   Sexual activity: Not on file  Other Topics Concern   Not on file  Social History Narrative   Lives in [place] with son. Smoke 5- cig/day; No alcohol; works at [employer].    Social Determinants of Health   Financial Resource Strain: Not on file  Food Insecurity: Not on file  Transportation Needs: Not on file  Physical Activity: Not on file  Stress: Not on file  Social Connections: Not on file  Intimate Partner Violence: Not on file    FAMILY HISTORY: Family History  Problem Relation Age of Onset   Cancer Maternal Grandfather    Cancer Paternal Grandmother    Pancreatic cancer Paternal Grandmother     ALLERGIES:  has No Known Allergies.  MEDICATIONS:  Current Outpatient  Medications  Medication Sig Dispense Refill   atorvastatin (LIPITOR) 20 MG tablet Take 1 tablet by mouth daily.     losartan-hydrochlorothiazide (HYZAAR) 100-25 MG tablet Take 1 tablet by mouth daily.     pantoprazole (PROTONIX) 20 MG tablet Take 1 tablet (20 mg total) by mouth daily. 30 tablet 1   sucralfate (CARAFATE) 1 g tablet Take 1 tablet (1 g total) by mouth 4 (four) times daily.  120 tablet 1   atenolol (TENORMIN) 50 MG tablet Take 50 mg by mouth daily. (Patient not taking: Reported on 04/10/2021)     methylPREDNISolone (MEDROL DOSEPAK) 4 MG TBPK tablet Use as directed. (Patient not taking: Reported on 04/10/2021) 21 tablet 1   orphenadrine (NORFLEX) 100 MG tablet Take 1 tablet (100 mg total) by mouth 2 (two) times daily. (Patient not taking: Reported on 04/10/2021) 10 tablet 0   No current facility-administered medications for this visit.      PHYSICAL EXAMINATION:   Vitals:   04/10/21 1114  BP: (!) 157/94  Pulse: (!) 56  Resp: 18  Temp: 98.4 F (36.9 C)   Filed Weights   04/10/21 1114  Weight: 188 lb (85.3 kg)    Physical Exam Vitals and nursing note reviewed.  Constitutional:      Comments: Ambulating: independently;   Alone  HENT:     Head: Normocephalic and atraumatic.     Mouth/Throat:     Pharynx: Oropharynx is clear.  Eyes:     Extraocular Movements: Extraocular movements intact.     Pupils: Pupils are equal, round, and reactive to light.  Cardiovascular:     Rate and Rhythm: Normal rate and regular rhythm.  Pulmonary:     Comments: Decreased breath sounds bilaterally.  Abdominal:     Palpations: Abdomen is soft.  Musculoskeletal:        General: Normal range of motion.     Cervical back: Normal range of motion.  Skin:    General: Skin is warm.  Neurological:     General: No focal deficit present.     Mental Status: She is alert and oriented to person, place, and time.  Psychiatric:        Behavior: Behavior normal.        Judgment: Judgment normal.    LABORATORY DATA:  I have reviewed the data as listed Lab Results  Component Value Date   WBC 4.9 04/10/2021   HGB 11.1 (L) 04/10/2021   HCT 34.3 (L) 04/10/2021   MCV 88.9 04/10/2021   PLT 329 04/10/2021   Recent Labs    08/13/20 1258 12/10/20 0942 04/10/21 1032  NA 137 137 135  K 4.2 4.0 3.8  CL 109 110 110  CO2 20* 23 20*  GLUCOSE 91 103* 113*  BUN 20 15 15    CREATININE 2.29* 2.20* 1.94*  CALCIUM 10.4* 10.0 9.2  GFRNONAA 26* 27* 32*  PROT 7.4 7.3  --   ALBUMIN 3.4* 3.4*  --   AST 22 20  --   ALT 15 16  --   ALKPHOS 73 82  --   BILITOT 0.6 0.6  --      No results found.  Iron deficiency #Iron deficient anemia [ferritin 3.5/hemoglobin 8.2-June 2022; [facility]]. S/p IV iron infusion- improved Hb 11.8; HOLD iron infusion.  Recommend starting p.o. iron pills/gentle iron [Hx of constipation]  #Elevated blood pressure-150-140s at home. again recommend compliance with her blood pressure medications; close follow-up with nephrology.  #Active smoker:counselled to quit smoking/cutting back [  3 cig/day]  # DISPOSITION: # HOLD venofer today # follow up in 6 months MD; labs- cbc/bmp; Iron studies/ferritin; possible venfoer--Dr.B   All questions were answered. The patient knows to call the clinic with any problems, questions or concerns.    Cammie Sickle, MD 04/13/2021 2:07 PM

## 2021-04-10 NOTE — Patient Instructions (Signed)
#  Recommend gentle iron 1 pill a day [over-the-counter]

## 2021-04-10 NOTE — Assessment & Plan Note (Addendum)
#  Iron deficient anemia [ferritin 3.5/hemoglobin 8.2-June 2022; [facility]]. S/p IV iron infusion- improved Hb 11.8; HOLD iron infusion.  Recommend starting p.o. iron pills/gentle iron [Hx of constipation]  #Elevated blood pressure-150-140s at home. again recommend compliance with her blood pressure medications; close follow-up with nephrology.  #Active smoker:counselled to quit smoking/cutting back [3 cig/day]  # DISPOSITION: # HOLD venofer today # follow up in 6 months MD; labs- cbc/bmp; Iron studies/ferritin; possible venfoer--Dr.B

## 2021-04-10 NOTE — Progress Notes (Signed)
Patient denies new problems/concerns today.   °

## 2021-04-13 ENCOUNTER — Encounter: Payer: Self-pay | Admitting: Internal Medicine

## 2021-05-17 ENCOUNTER — Encounter: Payer: Self-pay | Admitting: Internal Medicine

## 2021-05-17 ENCOUNTER — Other Ambulatory Visit: Payer: Self-pay

## 2021-05-17 ENCOUNTER — Emergency Department
Admission: EM | Admit: 2021-05-17 | Discharge: 2021-05-17 | Disposition: A | Discharge status: Home / Self Care | Payer: Medicaid Other | Attending: Emergency Medicine | Admitting: Emergency Medicine

## 2021-05-17 ENCOUNTER — Emergency Department: Payer: Medicaid Other

## 2021-05-17 DIAGNOSIS — M549 Dorsalgia, unspecified: Secondary | ICD-10-CM

## 2021-05-17 DIAGNOSIS — R7989 Other specified abnormal findings of blood chemistry: Secondary | ICD-10-CM | POA: Insufficient documentation

## 2021-05-17 DIAGNOSIS — R0602 Shortness of breath: Secondary | ICD-10-CM | POA: Insufficient documentation

## 2021-05-17 DIAGNOSIS — F17200 Nicotine dependence, unspecified, uncomplicated: Secondary | ICD-10-CM | POA: Insufficient documentation

## 2021-05-17 DIAGNOSIS — N189 Chronic kidney disease, unspecified: Secondary | ICD-10-CM | POA: Insufficient documentation

## 2021-05-17 DIAGNOSIS — M546 Pain in thoracic spine: Secondary | ICD-10-CM | POA: Insufficient documentation

## 2021-05-17 DIAGNOSIS — M7989 Other specified soft tissue disorders: Secondary | ICD-10-CM | POA: Insufficient documentation

## 2021-05-17 LAB — CBC
HCT: 38.4 % (ref 36.0–46.0)
Hemoglobin: 12.2 g/dL (ref 12.0–15.0)
MCH: 27.9 pg (ref 26.0–34.0)
MCHC: 31.8 g/dL (ref 30.0–36.0)
MCV: 87.7 fL (ref 80.0–100.0)
Platelets: 387 10*3/uL (ref 150–400)
RBC: 4.38 MIL/uL (ref 3.87–5.11)
RDW: 16.3 % — ABNORMAL HIGH (ref 11.5–15.5)
WBC: 5.4 10*3/uL (ref 4.0–10.5)
nRBC: 0 % (ref 0.0–0.2)

## 2021-05-17 LAB — BASIC METABOLIC PANEL
Anion gap: 6 (ref 5–15)
BUN: 17 mg/dL (ref 6–20)
CO2: 19 mmol/L — ABNORMAL LOW (ref 22–32)
Calcium: 10 mg/dL (ref 8.9–10.3)
Chloride: 111 mmol/L (ref 98–111)
Creatinine, Ser: 2.32 mg/dL — ABNORMAL HIGH (ref 0.44–1.00)
GFR, Estimated: 26 mL/min — ABNORMAL LOW (ref >60)
Glucose, Bld: 84 mg/dL (ref 70–99)
Potassium: 4 mmol/L (ref 3.5–5.1)
Sodium: 136 mmol/L (ref 135–145)

## 2021-05-17 LAB — TROPONIN I (HIGH SENSITIVITY)
Troponin I (High Sensitivity): 21 ng/L — ABNORMAL HIGH (ref <18)
Troponin I (High Sensitivity): 24 ng/L — ABNORMAL HIGH (ref <18)

## 2021-05-17 LAB — POC URINE PREG, ED: Preg Test, Ur: NEGATIVE

## 2021-05-17 LAB — D-DIMER, QUANTITATIVE: D-Dimer, Quant: 0.94 ug/mL-FEU — ABNORMAL HIGH (ref 0.00–0.50)

## 2021-05-17 MED ORDER — CYCLOBENZAPRINE HCL 5 MG PO TABS
5.0000 mg | ORAL_TABLET | Freq: Three times a day (TID) | ORAL | 0 refills | Status: AC | PRN
Start: 2021-05-17 — End: ?

## 2021-05-17 MED ORDER — CYCLOBENZAPRINE HCL 10 MG PO TABS
5.0000 mg | ORAL_TABLET | Freq: Once | ORAL | Status: AC
Start: 2021-05-17 — End: 2021-05-17
  Administered 2021-05-17: 5 mg via ORAL
  Filled 2021-05-17: qty 1

## 2021-05-17 NOTE — ED Notes (Signed)
Pt states increased back pain and shortness of breath over the past few days. Difficult to rest and difficult to work. Pt has CHTN and takes medication ?

## 2021-05-17 NOTE — Discharge Instructions (Signed)
Please seek medical attention for any high fevers, chest pain, shortness of breath, change in behavior, persistent vomiting, bloody stool or any other new or concerning symptoms.  

## 2021-05-17 NOTE — ED Triage Notes (Signed)
Pt arrived pov with spouse. Pt has been having difficulty taking a deep breath over the past few days. When pt takes a deep breath she is experiencing chest and back pain.  ?

## 2021-05-17 NOTE — ED Provider Notes (Signed)
? ?[facility] ?Provider Note ? ? ? Event Date/Time  ? First MD Initiated Contact with Patient 05/17/21 2053   ?  (approximate) ? ? ?History  ? ?Shortness of Breath ? ? ?HPI ? ?MADINAH QUARRY is a 46 y.o. female who presents to the emergency department today because of concerns for right mid/upper back pain.  She states it started about a week ago.  Has been fairly constant since then.  It is severe.  It will keep her up at night.  She feels like she is having a hard time getting a deep breath with this pain.  She had similar pain 2 months ago but it resolved on its own.  She denies any unusual exertion prior to the pain starting.  Has tried Tiger balm which did not provide relief.  Patient has not appreciated any fevers.  States she occasionally will have lower leg swelling. Does smoke.  ? ? ?Physical Exam  ? ?Triage Vital Signs: ?ED Triage Vitals  ?Enc Vitals Group  ?   BP 05/17/21 1950 (!) 176/124  ?   Pulse Rate 05/17/21 1950 (!) 49  ?   Resp 05/17/21 1950 (!) 21  ?   Temp 05/17/21 1950 99 ?F (37.2 ?C)  ?   Temp Source 05/17/21 1950 Oral  ?   SpO2 05/17/21 1943 100 %  ?   Weight 05/17/21 1951 187 lb (84.8 kg)  ?   Height 05/17/21 1951 5\' 9"  (1.753 m)  ?   Head Circumference --   ?   Peak Flow --   ?   Pain Score 05/17/21 1950 10  ?   Pain Loc --   ?   Pain Edu? --   ?   Excl. in [place]? --   ? ? ?Most recent vital signs: ?Vitals:  ? 05/17/21 1943 05/17/21 1950  ?BP:  (!) 176/124  ?Pulse:  (!) 49  ?Resp:  (!) 21  ?Temp:  99 ?F (37.2 ?C)  ?SpO2: 100% 100%  ? ? ?General: Awake, no distress.  ?CV:  Good peripheral perfusion. Bradycardia, no murmurs. ?Resp:  Normal effort. Lungs clear to auscultation ?Abd:  No distention.  ?MSK:  No lower extremity edema.  ? ? ?ED Results / Procedures / Treatments  ? ?Labs ?(all labs ordered are listed, but only abnormal results are displayed) ?Labs Reviewed  ?BASIC METABOLIC PANEL - Abnormal; Notable for the following components:  ?    Result Value  ? CO2 19 (*)   ?  Creatinine, Ser 2.32 (*)   ? GFR, Estimated 26 (*)   ? All other components within normal limits  ?CBC - Abnormal; Notable for the following components:  ? RDW 16.3 (*)   ? All other components within normal limits  ?D-DIMER, QUANTITATIVE - Abnormal; Notable for the following components:  ? D-Dimer, Quant 0.94 (*)   ? All other components within normal limits  ?TROPONIN I (HIGH SENSITIVITY) - Abnormal; Notable for the following components:  ? Troponin I (High Sensitivity) 21 (*)   ? All other components within normal limits  ?TROPONIN I (HIGH SENSITIVITY) - Abnormal; Notable for the following components:  ? Troponin I (High Sensitivity) 24 (*)   ? All other components within normal limits  ?POC URINE PREG, ED  ? ? ? ?EKG ? ?INance Pear, attending physician, personally viewed and interpreted this EKG ? ?EKG Time: 2018 ?Rate: 55 ?Rhythm: sinus bradycardia ?Axis: normal ?Intervals: qtc 384 ?QRS: narrow ?ST changes: no  st elevation ?Impression: abnormal ekg ? ? ?RADIOLOGY ?I independently interpreted and visualized the CXR. My interpretation: No pneumonia. No pneumothorax. ?Radiology interpretation:  ?IMPRESSION:  ?Negative radiographs of the chest.  ? ? ? ?PROCEDURES: ? ?Critical Care performed: No ? ?Procedures ? ? ?MEDICATIONS ORDERED IN ED: ?Medications - No data to display ? ? ?IMPRESSION / MDM / ASSESSMENT AND PLAN / ED COURSE  ?I reviewed the triage vital signs and the nursing notes. ?             ?               ? ?Differential diagnosis includes, but is not limited to, musculoskeletal pain, pneumonia, pneumothorax. ? ?Patient presents to the emergency department today because of concern for back pain and shortness of breath. On exam patient in no respiratory distress. No hypoxia or tachycardia. CXR without concerning abnormality.  Blood work did show a slight elevation of troponin however on repeat check this did not change significantly.  Patient has had a history of some chronic kidney disease and I  think this could explain the elevation of the troponin.  Given history of smoking D-dimer was checked.  This did turn positive.  I discussed this with patient.  Given history of chronic kidney disease she would not be able to obtain a CT angio so VQ scan was ordered.  When I discussed with patient that this would take a number of hours to obtain she did ask about discharge.  I discussed concern for possible blood clot with patient and delay of diagnosis however I do have somewhat low suspicion given lack of hypoxia or tachycardia as well as her having similar pain in the past.  While of course my preference would be for patient to undergo imaging to further evaluate at this time given the patient is requesting discharge and that she does feel better after medication I do not think it is unreasonable for her to follow-up with her primary care.  I did strongly encourage patient to follow-up closely with primary care to obtain imaging.  Additionally encourage patient to return for any worsening symptoms. ? ? ?FINAL CLINICAL IMPRESSION(S) / ED DIAGNOSES  ? ?Final diagnoses:  ?Back pain, unspecified back location, unspecified back pain laterality, unspecified chronicity  ? ? ? ?Rx / DC Orders  ? ?ED Discharge Orders   ? ?      Ordered  ?  cyclobenzaprine (FLEXERIL) 5 MG tablet  3 times daily PRN       ? 05/17/21 2321  ? ?  ?  ? ?  ? ? ? ?Note:  This document was prepared using Dragon voice recognition software and may include unintentional dictation errors. ? ?  ?Nance Pear, MD ?05/17/21 2332 ? ?

## 2021-05-18 ENCOUNTER — Telehealth: Payer: Self-pay

## 2021-05-18 NOTE — Telephone Encounter (Signed)
Scheduled for 05/19/2021 °

## 2021-05-19 ENCOUNTER — Encounter: Payer: Self-pay | Admitting: Gastroenterology

## 2021-05-19 ENCOUNTER — Other Ambulatory Visit: Payer: Self-pay

## 2021-05-19 ENCOUNTER — Ambulatory Visit (INDEPENDENT_AMBULATORY_CARE_PROVIDER_SITE_OTHER): Payer: Self-pay | Admitting: Gastroenterology

## 2021-05-19 VITALS — BP 150/90 | HR 59 | Temp 98.4°F | Ht 69.0 in | Wt 178.1 lb

## 2021-05-19 DIAGNOSIS — R140 Abdominal distension (gaseous): Secondary | ICD-10-CM

## 2021-05-19 DIAGNOSIS — R1013 Epigastric pain: Secondary | ICD-10-CM

## 2021-05-19 DIAGNOSIS — R194 Change in bowel habit: Secondary | ICD-10-CM

## 2021-05-19 DIAGNOSIS — D509 Iron deficiency anemia, unspecified: Secondary | ICD-10-CM

## 2021-05-19 DIAGNOSIS — R634 Abnormal weight loss: Secondary | ICD-10-CM

## 2021-05-19 MED ORDER — NA SULFATE-K SULFATE-MG SULF 17.5-3.13-1.6 GM/177ML PO SOLN: 354.0000 mL | Freq: Once | ORAL | 0 refills | Status: AC

## 2021-05-19 NOTE — Progress Notes (Signed)
?  ?Gina Darby, MD ?[address]  ?[address]  ?[place], [place] [address]  ?Main: [phone]  ?Fax: [phone] ? ? ? ?Gastroenterology Consultation ? ?Referring Provider:     Derald Macleod, MD ?Primary Care Physician:  Gina Coffin, MD ?Primary Gastroenterologist:  Dr. Cephas Doyle ?Reason for Consultation:     Epigastric pain, iron deficiency anemia ?      ? HPI:   ?Gina Doyle is a 46 y.o. female referred by Dr. Donnie Coffin, MD  for consultation & management of chronic epigastric pain as well as iron deficiency anemia.  Patient has been experiencing more than 6 months history of severe epigastric burning, radiating down to the lower abdomen, associated with nausea, severe postprandial urgency, loose stools.  Patient went to the ER in June secondary to epigastric pain associated with nausea and vomiting, underwent CT abdomen and pelvis without contrast which was unremarkable except for few small foci of lucency within the iliac bones which was indeterminate.  Patient has history of chronic kidney disease.  Her serum lipase levels were normal at that time.  LFTs have been normal.  She has been closely followed by hematology oncology, Dr. Rogue Bussing for parenteral iron therapy.  Her most recent hemoglobin is 12, serum ferritin was 7.  Patient reports that for last few weeks, her symptoms have gotten worse, she also reports substernal chest discomfort which is severe burning, has history of heartburn.  She went to the ER on 05/17/2021.  She is given sucralfate 1 g tablet 4 times daily along with Protonix 20 mg once a day.  Patient denies any black stools, rectal bleeding, abdominal bloating.  Patient did lose about 20 pounds within last 6 months due to her ongoing GI symptoms. ? ?She does smoke, does not drink alcohol ?She works in [employer] ? ?NSAIDs: None ? ?Antiplts/Anticoagulants/Anti thrombotics: None ? ?GI Procedures: None ?She denies family history of GI malignancy ? ?Past Medical  History:  ?Diagnosis Date  ? CKD (chronic kidney disease)   ? GERD (gastroesophageal reflux disease)   ? History of COVID-19 03/2020  ? Hypertension   ? IDA (iron deficiency anemia)   ? Peptic ulcer disease   ? ? ?History reviewed. No pertinent surgical history. ? ?Current Outpatient Medications:  ?  cyclobenzaprine (FLEXERIL) 5 MG tablet, Take 1 tablet (5 mg total) by mouth 3 (three) times daily as needed for muscle spasms (back pain)., Disp: 20 tablet, Rfl: 0 ?  losartan-hydrochlorothiazide (HYZAAR) 100-25 MG tablet, Take 1 tablet by mouth daily., Disp: , Rfl:  ?  Na Sulfate-K Sulfate-Mg Sulf 17.5-3.13-1.6 GM/177ML SOLN, Take 354 mLs by mouth once for 1 dose., Disp: 354 mL, Rfl: 0 ?  pantoprazole (PROTONIX) 20 MG tablet, Take 1 tablet (20 mg total) by mouth daily., Disp: 30 tablet, Rfl: 1 ?  sucralfate (CARAFATE) 1 g tablet, Take 1 tablet (1 g total) by mouth 4 (four) times daily., Disp: 120 tablet, Rfl: 1 ? ?Family History  ?Problem Relation Age of Onset  ? Cancer Maternal Grandfather   ? Cancer Paternal Grandmother   ? Pancreatic cancer Paternal Grandmother   ?  ? ?Social History  ? ?Tobacco Use  ? Smoking status: Every Day  ?  Packs/day: 0.25  ?  Types: Cigarettes  ? Smokeless tobacco: Never  ?Substance Use Topics  ? Alcohol use: Not Currently  ? Drug use: Not Currently  ? ? ?Allergies as of 05/19/2021  ? (No Known Allergies)  ? ? ?  Review of Systems:    ?All systems reviewed and negative except where noted in HPI. ? ? Physical Exam:  ?BP (!) 150/90 (BP Location: Left Arm, Patient Position: Sitting, Cuff Size: Normal)   Pulse (!) 59   Temp 98.4 ?F (36.9 ?C) (Oral)   Ht 5\' 9"  (1.753 m)   Wt 178 lb 2 oz (80.8 kg)   LMP 05/17/2021 (Exact Date)   BMI 26.30 kg/m?  ?Patient's last menstrual period was 05/17/2021 (exact date). ? ?General:   Alert,  Well-developed, well-nourished, pleasant and cooperative in NAD ?Head:  Normocephalic and atraumatic. ?Eyes:  Sclera clear, no icterus.   Conjunctiva pink. ?Ears:   Normal auditory acuity. ?Nose:  No deformity, discharge, or lesions. ?Mouth:  No deformity or lesions,oropharynx pink & moist. ?Neck:  Supple; no masses or thyromegaly. ?Lungs:  Respirations even and unlabored.  Clear throughout to auscultation.   No wheezes, crackles, or rhonchi. No acute distress. ?Heart:  Regular rate and rhythm; no murmurs, clicks, rubs, or gallops. ?Abdomen:  Normal bowel sounds. Soft, mild epigastric tenderness and non-distended without masses, hepatosplenomegaly or hernias noted.  No guarding or rebound tenderness.   ?Rectal: Not performed ?Msk:  Symmetrical without gross deformities. Good, equal movement & strength bilaterally. ?Pulses:  Normal pulses noted. ?Extremities:  No clubbing or edema.  No cyanosis. ?Neurologic:  Alert and oriented x3;  grossly normal neurologically. ?Skin:  Intact without significant lesions or rashes. No jaundice. ?Psych:  Alert and cooperative. Normal mood and affect. ? ?Imaging Studies: ?Reviewed ? ?Assessment and Plan:  ? ?Gina Doyle is a 46 y.o. pleasant African-American female with history of hypertension, hypertension induced CKD, chronic iron deficiency anemia is seen in consultation for chronic symptoms of dyspepsia ? ?Dyspepsia, unintentional weight loss: ?CT abdomen and pelvis without contrast in 6/22 was unremarkable ?Recommend EGD for further evaluation ?Check H. pylori breath test today ?Switch to omeprazole 40 mg once daily before breakfast for 1 month ? ?Loose stools, iron deficiency anemia, weight loss ?Recommend colonoscopy with TI evaluation and random colon biopsies ?If EGD and colonoscopy are unremarkable, recommend video capsule endoscopy ? ? ?Follow up in 2 months ? ? ?Gina Darby, MD ? ?

## 2021-05-20 ENCOUNTER — Encounter: Payer: Self-pay | Admitting: Gastroenterology

## 2021-05-20 LAB — H. PYLORI BREATH TEST: H pylori Breath Test: POSITIVE — AB

## 2021-05-21 ENCOUNTER — Telehealth: Payer: Self-pay

## 2021-05-21 DIAGNOSIS — A048 Other specified bacterial intestinal infections: Secondary | ICD-10-CM

## 2021-05-21 MED ORDER — CLARITHROMYCIN 500 MG PO TABS: 500.0000 mg | ORAL_TABLET | Freq: Two times a day (BID) | ORAL | 0 refills | Status: AC

## 2021-05-21 MED ORDER — OMEPRAZOLE 20 MG PO CPDR: 20.0000 mg | DELAYED_RELEASE_CAPSULE | Freq: Two times a day (BID) | ORAL | 0 refills | Status: DC

## 2021-05-21 MED ORDER — AMOXICILLIN 500 MG PO TABS: 1000.0000 mg | ORAL_TABLET | Freq: Two times a day (BID) | ORAL | 0 refills | Status: AC

## 2021-05-21 NOTE — Telephone Encounter (Signed)
-----   Message from Lin Landsman, MD sent at 05/20/2021  4:50 PM EST ----- ?H. pylori breath test came back positive for H. pylori infection.   ?Plz send her the prescription for triple therapy to treat H Pylori for 14days ?She should be stopping Protonix ? ?Omeprazole 20mg  BID ?Clarithromycin 500mg  BID ?Amoxicillin 1gm BID ? ?Order H Pylori breath test in 8 weeks after completing medication to confirm eradication. She should be off prilosec and H2 blocker atleast for 2weeks before the test ? ?Thanks ?RV ? ?

## 2021-05-21 NOTE — Telephone Encounter (Signed)
Patient verbalized understanding. She will stop the pantoprazole while on the treatment and stop the pantoprazole 2 weeks before coming for repeat H pylori breath test.  ? ?Sent mychart message to send on 07/16/21 to remind her to come in for lab work  ? ?

## 2021-05-26 ENCOUNTER — Ambulatory Visit: Payer: Self-pay | Admitting: Anesthesiology

## 2021-05-26 ENCOUNTER — Encounter
Admission: RE | Disposition: A | Discharge status: Home / Self Care | Payer: Self-pay | Source: Home / Self Care | Attending: Gastroenterology

## 2021-05-26 ENCOUNTER — Encounter: Payer: Self-pay | Admitting: Gastroenterology

## 2021-05-26 ENCOUNTER — Other Ambulatory Visit: Payer: Self-pay

## 2021-05-26 ENCOUNTER — Ambulatory Visit
Admission: RE | Admit: 2021-05-26 | Discharge: 2021-05-26 | Disposition: A | Discharge status: Home / Self Care | Payer: Self-pay | Attending: Gastroenterology | Admitting: Gastroenterology

## 2021-05-26 DIAGNOSIS — D509 Iron deficiency anemia, unspecified: Secondary | ICD-10-CM | POA: Insufficient documentation

## 2021-05-26 DIAGNOSIS — K2980 Duodenitis without bleeding: Secondary | ICD-10-CM | POA: Insufficient documentation

## 2021-05-26 DIAGNOSIS — K449 Diaphragmatic hernia without obstruction or gangrene: Secondary | ICD-10-CM | POA: Insufficient documentation

## 2021-05-26 DIAGNOSIS — B9681 Helicobacter pylori [H. pylori] as the cause of diseases classified elsewhere: Secondary | ICD-10-CM | POA: Insufficient documentation

## 2021-05-26 DIAGNOSIS — R1013 Epigastric pain: Secondary | ICD-10-CM | POA: Insufficient documentation

## 2021-05-26 DIAGNOSIS — K269 Duodenal ulcer, unspecified as acute or chronic, without hemorrhage or perforation: Secondary | ICD-10-CM | POA: Insufficient documentation

## 2021-05-26 DIAGNOSIS — K635 Polyp of colon: Secondary | ICD-10-CM | POA: Insufficient documentation

## 2021-05-26 DIAGNOSIS — K2950 Unspecified chronic gastritis without bleeding: Secondary | ICD-10-CM | POA: Insufficient documentation

## 2021-05-26 DIAGNOSIS — K644 Residual hemorrhoidal skin tags: Secondary | ICD-10-CM | POA: Insufficient documentation

## 2021-05-26 HISTORY — PX: COLONOSCOPY WITH PROPOFOL: SHX5780

## 2021-05-26 HISTORY — PX: ESOPHAGOGASTRODUODENOSCOPY (EGD) WITH PROPOFOL: SHX5813

## 2021-05-26 LAB — POCT PREGNANCY, URINE: Preg Test, Ur: NEGATIVE

## 2021-05-26 SURGERY — COLONOSCOPY WITH PROPOFOL
Anesthesia: General

## 2021-05-26 MED ORDER — OMEPRAZOLE 40 MG PO CPDR
40.0000 mg | DELAYED_RELEASE_CAPSULE | Freq: Two times a day (BID) | ORAL | 2 refills | Status: AC
Start: 2021-05-26 — End: 2022-04-23

## 2021-05-26 MED ORDER — LACTATED RINGERS IV SOLN: INTRAVENOUS | Status: DC

## 2021-05-26 MED ORDER — SODIUM CHLORIDE 0.9 % IV SOLN: INTRAVENOUS | Status: DC

## 2021-05-26 MED ORDER — PROPOFOL 10 MG/ML IV BOLUS
INTRAVENOUS | Status: DC | PRN
  Administered 2021-05-26 (×2): 30 mg via INTRAVENOUS
  Administered 2021-05-26: 20 mg via INTRAVENOUS
  Administered 2021-05-26: 80 mg via INTRAVENOUS
  Administered 2021-05-26 (×4): 30 mg via INTRAVENOUS
  Administered 2021-05-26: 40 mg via INTRAVENOUS
  Administered 2021-05-26 (×3): 30 mg via INTRAVENOUS
  Administered 2021-05-26: 20 mg via INTRAVENOUS
  Administered 2021-05-26: 40 mg via INTRAVENOUS
  Administered 2021-05-26 (×4): 30 mg via INTRAVENOUS

## 2021-05-26 MED ORDER — ACETAMINOPHEN 325 MG PO TABS: 325.0000 mg | ORAL_TABLET | Freq: Once | ORAL | Status: DC

## 2021-05-26 MED ORDER — STERILE WATER FOR IRRIGATION IR SOLN
Status: DC | PRN
Start: 2021-05-26 — End: 2021-05-26
  Administered 2021-05-26: 1000 mL

## 2021-05-26 MED ORDER — GLYCOPYRROLATE 0.2 MG/ML IJ SOLN
INTRAMUSCULAR | Status: DC | PRN
  Administered 2021-05-26: .1 mg via INTRAVENOUS

## 2021-05-26 MED ORDER — ACETAMINOPHEN 160 MG/5ML PO SOLN: 325.0000 mg | Freq: Once | ORAL | Status: DC

## 2021-05-26 MED ORDER — STERILE WATER FOR IRRIGATION IR SOLN
Status: DC | PRN
  Administered 2021-05-26: 120 mL

## 2021-05-26 MED ORDER — LIDOCAINE HCL (CARDIAC) PF 100 MG/5ML IV SOSY
PREFILLED_SYRINGE | INTRAVENOUS | Status: DC | PRN
  Administered 2021-05-26: 50 mg via INTRAVENOUS

## 2021-05-26 SURGICAL SUPPLY — 38 items
BALLN DILATOR 10-12 8 (BALLOONS)
BALLN DILATOR 12-15 8 (BALLOONS)
BALLN DILATOR 15-18 8 (BALLOONS)
BALLN DILATOR CRE 0-12 8 (BALLOONS)
BALLN DILATOR ESOPH 8 10 CRE (MISCELLANEOUS) IMPLANT
BALLOON DILATOR 12-15 8 (BALLOONS) IMPLANT
BALLOON DILATOR 15-18 8 (BALLOONS) IMPLANT
BALLOON DILATOR CRE 0-12 8 (BALLOONS) IMPLANT
BLOCK BITE 60FR ADLT L/F GRN (MISCELLANEOUS) ×2 IMPLANT
CLIP HMST 235XBRD CATH ROT (MISCELLANEOUS) IMPLANT
CLIP RESOLUTION 360 11X235 (MISCELLANEOUS)
ELECT REM PT RETURN 9FT ADLT (ELECTROSURGICAL)
ELECTRODE REM PT RTRN 9FT ADLT (ELECTROSURGICAL) IMPLANT
FCP ESCP3.2XJMB 240X2.8X (MISCELLANEOUS)
FORCEPS BIOP RAD 4 LRG CAP 4 (CUTTING FORCEPS) IMPLANT
FORCEPS BIOP RJ4 240 W/NDL (MISCELLANEOUS)
FORCEPS ESCP3.2XJMB 240X2.8X (MISCELLANEOUS) IMPLANT
GOWN CVR UNV OPN BCK APRN NK (MISCELLANEOUS) ×2 IMPLANT
GOWN ISOL THUMB LOOP REG UNIV (MISCELLANEOUS) ×4
INJECTOR VARIJECT VIN23 (MISCELLANEOUS) IMPLANT
KIT DEFENDO VALVE AND CONN (KITS) IMPLANT
KIT PRC NS LF DISP ENDO (KITS) ×1 IMPLANT
KIT PROCEDURE OLYMPUS (KITS) ×2
MANIFOLD NEPTUNE II (INSTRUMENTS) ×2 IMPLANT
MARKER SPOT ENDO TATTOO 5ML (MISCELLANEOUS) IMPLANT
PROBE APC STR FIRE (PROBE) IMPLANT
RETRIEVER NET PLAT FOOD (MISCELLANEOUS) IMPLANT
RETRIEVER NET ROTH 2.5X230 LF (MISCELLANEOUS) IMPLANT
SNARE COLD EXACTO (MISCELLANEOUS) ×1 IMPLANT
SNARE SHORT THROW 13M SML OVAL (MISCELLANEOUS) IMPLANT
SNARE SHORT THROW 30M LRG OVAL (MISCELLANEOUS) IMPLANT
SNARE SNG USE RND 15MM (INSTRUMENTS) IMPLANT
SPOT EX ENDOSCOPIC TATTOO (MISCELLANEOUS)
SYR INFLATION 60ML (SYRINGE) IMPLANT
TRAP ETRAP POLY (MISCELLANEOUS) IMPLANT
VARIJECT INJECTOR VIN23 (MISCELLANEOUS)
WATER STERILE IRR 250ML POUR (IV SOLUTION) ×2 IMPLANT
WIRE CRE 18-20MM 8CM F G (MISCELLANEOUS) IMPLANT

## 2021-05-26 NOTE — Anesthesia Procedure Notes (Signed)
Date/Time: 05/26/2021 10:55 AM ?Performed by: Mayme Genta, CRNA ?Pre-anesthesia Checklist: Patient identified, Emergency Drugs available, Suction available, Timeout performed and Patient being monitored ?Patient Re-evaluated:Patient Re-evaluated prior to induction ?Oxygen Delivery Method: Nasal cannula ?Placement Confirmation: positive ETCO2 ? ? ? ? ?

## 2021-05-26 NOTE — Op Note (Signed)
[facility] ?Gastroenterology ?Patient Name: Gina Doyle ?Procedure Date: 05/26/2021 10:49 AM ?MRN: [id number] ?Account #: [id number] ?Date of Birth: August 31, 1975 ?Admit Type: Outpatient ?Age: 46 ?Room: [facility] OR ROOM 01 ?Gender: Female ?Note Status: Finalized ?Instrument Name: [id number] ?Procedure:             Colonoscopy ?Indications:           This is the patient's first colonoscopy, Unexplained  ?                       iron deficiency anemia ?Providers:             Lin Landsman MD, MD ?Referring MD:          Edmonia Lynch. Aycock MD (Referring MD) ?Medicines:             General Anesthesia ?Complications:         No immediate complications. Estimated blood loss: None. ?Procedure:             Pre-Anesthesia Assessment: ?                       - Prior to the procedure, a History and Physical was  ?                       performed, and patient medications and allergies were  ?                       reviewed. The patient is competent. The risks and  ?                       benefits of the procedure and the sedation options and  ?                       risks were discussed with the patient. All questions  ?                       were answered and informed consent was obtained.  ?                       Patient identification and proposed procedure were  ?                       verified by the physician, the nurse, the  ?                       anesthesiologist, the anesthetist and the technician  ?                       in the pre-procedure area in the procedure room in the  ?                       endoscopy suite. Mental Status Examination: alert and  ?                       oriented. Airway Examination: normal oropharyngeal  ?                       airway and neck mobility. Respiratory Examination:  ?  clear to auscultation. CV Examination: normal.  ?                       Prophylactic Antibiotics: The patient does not require  ?                       prophylactic antibiotics. Prior  Anticoagulants: The  ?                       patient has taken no previous anticoagulant or  ?                       antiplatelet agents. ASA Grade Assessment: II - A  ?                       patient with mild systemic disease. After reviewing  ?                       the risks and benefits, the patient was deemed in  ?                       satisfactory condition to undergo the procedure. The  ?                       anesthesia plan was to use general anesthesia.  ?                       Immediately prior to administration of medications,  ?                       the patient was re-assessed for adequacy to receive  ?                       sedatives. The heart rate, respiratory rate, oxygen  ?                       saturations, blood pressure, adequacy of pulmonary  ?                       ventilation, and response to care were monitored  ?                       throughout the procedure. The physical status of the  ?                       patient was re-assessed after the procedure. ?                       After obtaining informed consent, the colonoscope was  ?                       passed under direct vision. Throughout the procedure,  ?                       the patient's blood pressure, pulse, and oxygen  ?                       saturations were monitored continuously. The  ?  Colonoscope was introduced through the anus and  ?                       advanced to the the terminal ileum, with  ?                       identification of the appendiceal orifice and IC  ?                       valve. The colonoscopy was performed without  ?                       difficulty. The patient tolerated the procedure well.  ?                       The quality of the bowel preparation was evaluated  ?                       using the BBPS [facility] Bowel Preparation Scale) with  ?                       scores of: Right Colon = 3, Transverse Colon = 3 and  ?                       Left Colon = 3 (entire mucosa  seen well with no  ?                       residual staining, small fragments of stool or opaque  ?                       liquid). The total BBPS score equals 9. ?Findings: ?     The perianal and digital rectal examinations were normal. Pertinent  ?     negatives include normal sphincter tone and no palpable rectal lesions. ?     The terminal ileum appeared normal. ?     Three sessile polyps were found in the recto-sigmoid colon and sigmoid  ?     colon. The polyps were 4 to 6 mm in size. These polyps were removed with  ?     a cold snare. Resection and retrieval were complete. Estimated blood  ?     loss: none. ?     Non-bleeding external hemorrhoids were found during retroflexion. The  ?     hemorrhoids were medium-sized. ?Impression:            - The examined portion of the ileum was normal. ?                       - Three 4 to 6 mm polyps at the recto-sigmoid colon  ?                       and in the sigmoid colon, removed with a cold snare.  ?                       Resected and retrieved. ?                       - Non-bleeding external hemorrhoids. ?Recommendation:        -  Discharge patient to home (with escort). ?                       - Resume previous diet today. ?                       - Continue present medications. ?                       - Await pathology results. ?                       - Repeat colonoscopy in 5 to 7 years for surveillance  ?                       based on pathology results. ?Procedure Code(s):     --- Professional --- ?                       [phone], Colonoscopy, flexible; with removal of  ?                       tumor(s), polyp(s), or other lesion(s) by snare  ?                       technique ?Diagnosis Code(s):     --- Professional --- ?                       K64.4, Residual hemorrhoidal skin tags ?                       K63.5, Polyp of colon ?                       D50.9, Iron deficiency anemia, unspecified ?CPT copyright 2019 American Medical Association. All rights reserved. ?The  codes documented in this report are preliminary and upon coder review may  ?be revised to meet current compliance requirements. ?Dr. Ulyess Mort ?Gagandeep Kossman Raeanne Gathers MD, MD ?05/26/2021 11:29:20 AM ?This report has been signed electronically. ?Number of Addenda: 0 ?Note Initiated On: 05/26/2021 10:49 AM ?Scope Withdrawal Time: 0 hours 12 minutes 49 seconds  ?Total Procedure Duration: 0 hours 15 minutes 55 seconds  ?Estimated Blood Loss:  Estimated blood loss: none. ?     [facility] ?

## 2021-05-26 NOTE — Anesthesia Postprocedure Evaluation (Signed)
Anesthesia Post Note ? ?Patient: Gina Doyle ? ?Procedure(s) Performed: COLONOSCOPY WITH PROPOFOL ?ESOPHAGOGASTRODUODENOSCOPY (EGD) WITH PROPOFOL ? ? ?  ?Patient location during evaluation: PACU ?Anesthesia Type: General ?Level of consciousness: awake and alert and oriented ?Pain management: satisfactory to patient ?Vital Signs Assessment: post-procedure vital signs reviewed and stable ?Respiratory status: spontaneous breathing, nonlabored ventilation and respiratory function stable ?Cardiovascular status: blood pressure returned to baseline and stable ?Postop Assessment: Adequate PO intake and No signs of nausea or vomiting ?Anesthetic complications: no ? ? ?No notable events documented. ? ?Raliegh Ip ? ? ? ? ? ?

## 2021-05-26 NOTE — Transfer of Care (Signed)
Immediate Anesthesia Transfer of Care Note ? ?Patient: Gina Doyle ? ?Procedure(s) Performed: COLONOSCOPY WITH PROPOFOL ?ESOPHAGOGASTRODUODENOSCOPY (EGD) WITH PROPOFOL ? ?Patient Location: PACU ? ?Anesthesia Type: General ? ?Level of Consciousness: awake, alert  and patient cooperative ? ?Airway and Oxygen Therapy: Patient Spontanous Breathing and Patient connected to supplemental oxygen ? ?Post-op Assessment: Post-op Vital signs reviewed, Patient's Cardiovascular Status Stable, Respiratory Function Stable, Patent Airway and No signs of Nausea or vomiting ? ?Post-op Vital Signs: Reviewed and stable ? ?Complications: No notable events documented. ? ?

## 2021-05-26 NOTE — Anesthesia Preprocedure Evaluation (Signed)
Anesthesia Evaluation  ?Patient identified by MRN, date of birth, ID band ?Patient awake ? ? ? ?Reviewed: ?Allergy & Precautions, H&P , NPO status , Patient's Chart, lab work & pertinent test results ? ?Airway ?Mallampati: II ? ?TM Distance: >3 FB ?Neck ROM: full ? ? ? Dental ?no notable dental hx. ? ?  ?Pulmonary ?Current SmokerPatient did not abstain from smoking.,  ?  ?Pulmonary exam normal ?breath sounds clear to auscultation ? ? ? ? ? ? Cardiovascular ?hypertension, Normal cardiovascular exam ?Rhythm:regular Rate:Normal ? ? ?  ?Neuro/Psych ?  ? GI/Hepatic ?PUD, GERD  ,  ?Endo/Other  ? ? Renal/GU ?Renal disease  ? ?  ?Musculoskeletal ? ? Abdominal ?  ?Peds ? Hematology ?  ?Anesthesia Other Findings ? ? Reproductive/Obstetrics ? ?  ? ? ? ? ? ? ? ? ? ? ? ? ? ?  ?  ? ? ? ? ? ? ? ? ?Anesthesia Physical ?Anesthesia Plan ? ?ASA: 2 ? ?Anesthesia Plan: General  ? ?Post-op Pain Management: Minimal or no pain anticipated  ? ?Induction: Intravenous ? ?PONV Risk Score and Plan: 3 and Treatment may vary due to age or medical condition, Propofol infusion and TIVA ? ?Airway Management Planned: Natural Airway ? ?Additional Equipment:  ? ?Intra-op Plan:  ? ?Post-operative Plan:  ? ?Informed Consent: I have reviewed the patients History and Physical, chart, labs and discussed the procedure including the risks, benefits and alternatives for the proposed anesthesia with the patient or authorized representative who has indicated his/her understanding and acceptance.  ? ? ? ?Dental Advisory Given ? ?Plan Discussed with: CRNA ? ?Anesthesia Plan Comments:   ? ? ? ? ? ? ?Anesthesia Quick Evaluation ? ?

## 2021-05-26 NOTE — Op Note (Signed)
[facility] ?Gastroenterology ?Patient Name: Francee Setzer ?Procedure Date: 05/26/2021 10:50 AM ?MRN: [id number] ?Account #: [id number] ?Date of Birth: [birth year] ?Admit Type: Outpatient ?Age: 45 ?Room: [facility] OR ROOM 01 ?Gender: Female ?Note Status: Finalized ?Instrument Name: [id number] ?Procedure:             Upper GI endoscopy ?Indications:           Epigastric abdominal pain, Unexplained iron deficiency  ?                       anemia ?Providers:             Lin Landsman MD, MD ?Referring MD:          Edmonia Lynch. Aycock MD (Referring MD) ?Medicines:             See the Anesthesia note for documentation of the  ?                       administered medications, General Anesthesia ?Complications:         No immediate complications. Estimated blood loss: None. ?Procedure:             Pre-Anesthesia Assessment: ?                       - Prior to the procedure, a History and Physical was  ?                       performed, and patient medications and allergies were  ?                       reviewed. The patient is competent. The risks and  ?                       benefits of the procedure and the sedation options and  ?                       risks were discussed with the patient. All questions  ?                       were answered and informed consent was obtained.  ?                       Patient identification and proposed procedure were  ?                       verified by the physician, the nurse, the  ?                       anesthesiologist, the anesthetist and the technician  ?                       in the pre-procedure area in the procedure room in the  ?                       endoscopy suite. Mental Status Examination: alert and  ?                       oriented. Airway Examination: normal oropharyngeal  ?  airway and neck mobility. Respiratory Examination:  ?                       clear to auscultation. CV Examination: normal.  ?                       Prophylactic  Antibiotics: The patient does not require  ?                       prophylactic antibiotics. Prior Anticoagulants: The  ?                       patient has taken no previous anticoagulant or  ?                       antiplatelet agents. ASA Grade Assessment: II - A  ?                       patient with mild systemic disease. After reviewing  ?                       the risks and benefits, the patient was deemed in  ?                       satisfactory condition to undergo the procedure. The  ?                       anesthesia plan was to use general anesthesia.  ?                       Immediately prior to administration of medications,  ?                       the patient was re-assessed for adequacy to receive  ?                       sedatives. The heart rate, respiratory rate, oxygen  ?                       saturations, blood pressure, adequacy of pulmonary  ?                       ventilation, and response to care were monitored  ?                       throughout the procedure. The physical status of the  ?                       patient was re-assessed after the procedure. ?                       After obtaining informed consent, the endoscope was  ?                       passed under direct vision. Throughout the procedure,  ?                       the patient's blood pressure, pulse, and oxygen  ?  saturations were monitored continuously. The was  ?                       introduced through the mouth, and advanced to the  ?                       second part of duodenum. The upper GI endoscopy was  ?                       accomplished without difficulty. The patient tolerated  ?                       the procedure well. ?Findings: ?     One non-bleeding cratered duodenal ulcer with a clean ulcer base and  ?     heaped up edges (Forrest Class III) was found in the duodenal bulb. The  ?     lesion was 10 mm in largest dimension. Biopsies from edge of the ulcer  ?     were taken with a cold  forceps for histology. ?     The entire examined stomach was normal. Biopsies were taken with a cold  ?     forceps for Helicobacter pylori testing. ?     A small hiatal hernia was present. ?     The gastroesophageal junction and examined esophagus were normal. ?Impression:            - Non-bleeding duodenal ulcer with a clean ulcer base  ?                       (Forrest Class III). Biopsied. ?                       - Normal stomach. Biopsied. ?                       - Small hiatal hernia. ?                       - Normal gastroesophageal junction and esophagus. ?Recommendation:        - Await pathology results. ?                       - Use Prilosec (omeprazole) 40 mg PO BID for 3 months. ?                       - No ibuprofen, naproxen, or other non-steroidal  ?                       anti-inflammatory drugs. ?                       - Return to my office as previously scheduled. ?                       - Proceed with colonoscopy as scheduled ?                       See colonoscopy report ?Procedure Code(s):     --- Professional --- ?  47159, Esophagogastroduodenoscopy, flexible,  ?                       transoral; with biopsy, single or multiple ?Diagnosis Code(s):     --- Professional --- ?                       K26.9, Duodenal ulcer, unspecified as acute or  ?                       chronic, without hemorrhage or perforation ?                       K44.9, Diaphragmatic hernia without obstruction or  ?                       gangrene ?                       R10.13, Epigastric pain ?                       D50.9, Iron deficiency anemia, unspecified ?CPT copyright 2019 American Medical Association. All rights reserved. ?The codes documented in this report are preliminary and upon coder review may  ?be revised to meet current compliance requirements. ?Dr. Ulyess Mort ?Denzel Etienne Raeanne Gathers MD, MD ?05/26/2021 11:08:55 AM ?This report has been signed electronically. ?Number of Addenda: 0 ?Note Initiated  On: 05/26/2021 10:50 AM ?Total Procedure Duration: 0 hours 6 minutes 43 seconds  ?Estimated Blood Loss:  Estimated blood loss: none. ?     [facility] ?

## 2021-05-26 NOTE — H&P (Signed)
?Cephas Darby, MD ?[address]  ?[address]  ?[place], [place] [address]  ?Main: [phone]  ?Fax: [phone] ?Pager: [phone] ? ?Primary Care Physician:  Donnie Coffin, MD ?Primary Gastroenterologist:  Dr. Cephas Darby ? ?Pre-Procedure History & Physical: ?HPI:  Gina Doyle is a 46 y.o. female is here for an endoscopy and colonoscopy. ?  ?Past Medical History:  ?Diagnosis Date  ? CKD (chronic kidney disease)   ? GERD (gastroesophageal reflux disease)   ? History of COVID-19 03/2020  ? Hypertension   ? IDA (iron deficiency anemia)   ? Peptic ulcer disease   ? ? ?History reviewed. No pertinent surgical history. ? ?Prior to Admission medications   ?Medication Sig Start Date End Date Taking? Authorizing Provider  ?amoxicillin (AMOXIL) 500 MG tablet Take 2 tablets (1,000 mg total) by mouth 2 (two) times daily for 14 days. 05/21/21 06/04/21 Yes Rayah Fines, Tally Due, MD  ?clarithromycin (BIAXIN) 500 MG tablet Take 1 tablet (500 mg total) by mouth 2 (two) times daily for 14 days. 05/21/21 06/04/21 Yes Maxmillian Carsey, Tally Due, MD  ?cyclobenzaprine (FLEXERIL) 5 MG tablet Take 1 tablet (5 mg total) by mouth 3 (three) times daily as needed for muscle spasms (back pain). 05/17/21  Yes Nance Pear, MD  ?losartan-hydrochlorothiazide (HYZAAR) 100-25 MG tablet Take 1 tablet by mouth daily. 12/17/20  Yes [provider]  ?omeprazole (PRILOSEC) 20 MG capsule Take 1 capsule (20 mg total) by mouth 2 (two) times daily before a meal for 14 days. 05/21/21 06/04/21 Yes Janila Arrazola, Tally Due, MD  ?pantoprazole (PROTONIX) 20 MG tablet Take 1 tablet (20 mg total) by mouth daily. ?Patient not taking: Reported on 05/26/2021 08/13/20 08/13/21  Versie Starks, PA-C  ?sucralfate (CARAFATE) 1 g tablet Take 1 tablet (1 g total) by mouth 4 (four) times daily. ?Patient not taking: Reported on 05/26/2021 08/13/20 08/13/21  Versie Starks, PA-C  ?metoCLOPramide (REGLAN) 10 MG tablet Take 1 tablet (10 mg total) by mouth every 8 (eight) hours as  needed for up to 5 days for nausea (or headache). 11/27/17 01/10/19  Arta Silence, MD  ?omeprazole (PRILOSEC OTC) 20 MG tablet Take 20 mg by mouth daily.  01/10/19  [provider]  ? ? ?Allergies as of 05/19/2021  ? (No Known Allergies)  ? ? ?Family History  ?Problem Relation Age of Onset  ? Cancer Maternal Grandfather   ? Cancer Paternal Grandmother   ? Pancreatic cancer Paternal Grandmother   ? ? ?Social History  ? ?Socioeconomic History  ? Marital status: Single  ?  Spouse name: Not on file  ? Number of children: Not on file  ? Years of education: Not on file  ? Highest education level: Not on file  ?Occupational History  ? Not on file  ?Tobacco Use  ? Smoking status: Every Day  ?  Packs/day: 0.25  ?  Years: 14.00  ?  Pack years: 3.50  ?  Types: Cigarettes  ? Smokeless tobacco: Never  ? Tobacco comments:  ?  Started smoking age 89.  No cigs since 05/13/21  ?Vaping Use  ? Vaping Use: Never used  ?Substance and Sexual Activity  ? Alcohol use: Not Currently  ? Drug use: Not Currently  ? Sexual activity: Not on file  ?Other Topics Concern  ? Not on file  ?Social History Narrative  ? Lives in [place] with son. Smoke 5- cig/day; No alcohol; works at [employer].   ? ?Social Determinants of Health  ? ?Financial  Resource Strain: Not on file  ?Food Insecurity: Not on file  ?Transportation Needs: Not on file  ?Physical Activity: Not on file  ?Stress: Not on file  ?Social Connections: Not on file  ?Intimate Partner Violence: Not on file  ? ? ?Review of Systems: ?See HPI, otherwise negative ROS ? ?Physical Exam: ?BP (!) 139/96   Pulse 70   Temp 97.9 ?F (36.6 ?C) (Temporal)   Resp 16   Ht 5\' 9"  (1.753 m)   Wt 79.8 kg   LMP 05/17/2021 (Exact Date)   SpO2 100%   BMI 25.99 kg/m?  ?General:   Alert,  pleasant and cooperative in NAD ?Head:  Normocephalic and atraumatic. ?Neck:  Supple; no masses or thyromegaly. ?Lungs:  Clear throughout to auscultation.    ?Heart:  Regular rate and rhythm. ?Abdomen:  Soft,  nontender and nondistended. Normal bowel sounds, without guarding, and without rebound.   ?Neurologic:  Alert and  oriented x4;  grossly normal neurologically. ? ?Impression/Plan: ?[name] is here for an endoscopy and colonoscopy to be performed for dyspepsia, weight loss, loose stools, IDA ? ?Risks, benefits, limitations, and alternatives regarding  endoscopy and colonoscopy have been reviewed with the patient.  Questions have been answered.  All parties agreeable. ? ? ?Sherri Sear, MD  05/26/2021, 9:48 AM ?

## 2021-05-27 ENCOUNTER — Encounter: Payer: Self-pay | Admitting: Gastroenterology

## 2021-05-27 LAB — SURGICAL PATHOLOGY

## 2021-05-29 ENCOUNTER — Telehealth: Payer: Self-pay | Admitting: Gastroenterology

## 2021-05-29 NOTE — Telephone Encounter (Signed)
[facility] states that pt has cancelled and no showed on 4 appointments will not resch ?

## 2021-06-01 NOTE — Telephone Encounter (Signed)
Please inform patient about it.  Also, please let Dr. Clide Deutscher know ? ?Thanks ? ?RV ?

## 2021-06-01 NOTE — Telephone Encounter (Signed)
Called and left patient a detail message and sent mychart message to patient  ?

## 2021-07-22 ENCOUNTER — Ambulatory Visit: Payer: Medicaid Other | Admitting: Gastroenterology

## 2021-08-18 ENCOUNTER — Encounter: Payer: Self-pay | Admitting: *Deleted

## 2021-08-18 ENCOUNTER — Other Ambulatory Visit: Payer: Self-pay

## 2021-08-18 ENCOUNTER — Emergency Department
Admission: EM | Admit: 2021-08-18 | Discharge: 2021-08-18 | Disposition: A | Discharge status: Home / Self Care | Payer: Medicaid Other | Attending: Emergency Medicine | Admitting: Emergency Medicine

## 2021-08-18 DIAGNOSIS — Z8616 Personal history of COVID-19: Secondary | ICD-10-CM | POA: Insufficient documentation

## 2021-08-18 DIAGNOSIS — I129 Hypertensive chronic kidney disease with stage 1 through stage 4 chronic kidney disease, or unspecified chronic kidney disease: Secondary | ICD-10-CM | POA: Insufficient documentation

## 2021-08-18 DIAGNOSIS — N898 Other specified noninflammatory disorders of vagina: Secondary | ICD-10-CM | POA: Insufficient documentation

## 2021-08-18 DIAGNOSIS — R944 Abnormal results of kidney function studies: Secondary | ICD-10-CM | POA: Insufficient documentation

## 2021-08-18 DIAGNOSIS — R7981 Abnormal blood-gas level: Secondary | ICD-10-CM | POA: Insufficient documentation

## 2021-08-18 DIAGNOSIS — R1030 Lower abdominal pain, unspecified: Secondary | ICD-10-CM | POA: Insufficient documentation

## 2021-08-18 DIAGNOSIS — N189 Chronic kidney disease, unspecified: Secondary | ICD-10-CM | POA: Insufficient documentation

## 2021-08-18 LAB — COMPREHENSIVE METABOLIC PANEL
ALT: 17 U/L (ref 0–44)
AST: 24 U/L (ref 15–41)
Albumin: 3.4 g/dL — ABNORMAL LOW (ref 3.5–5.0)
Alkaline Phosphatase: 106 U/L (ref 38–126)
Anion gap: 7 (ref 5–15)
BUN: 22 mg/dL — ABNORMAL HIGH (ref 6–20)
CO2: 18 mmol/L — ABNORMAL LOW (ref 22–32)
Calcium: 9.7 mg/dL (ref 8.9–10.3)
Chloride: 109 mmol/L (ref 98–111)
Creatinine, Ser: 2.23 mg/dL — ABNORMAL HIGH (ref 0.44–1.00)
GFR, Estimated: 27 mL/min — ABNORMAL LOW (ref >60)
Glucose, Bld: 97 mg/dL (ref 70–99)
Potassium: 4.3 mmol/L (ref 3.5–5.1)
Sodium: 134 mmol/L — ABNORMAL LOW (ref 135–145)
Total Bilirubin: 0.4 mg/dL (ref 0.3–1.2)
Total Protein: 7.6 g/dL (ref 6.5–8.1)

## 2021-08-18 LAB — WET PREP, GENITAL
Clue Cells Wet Prep HPF POC: NONE SEEN
Sperm: NONE SEEN
Trich, Wet Prep: NONE SEEN
WBC, Wet Prep HPF POC: <10 (ref <10)
Yeast Wet Prep HPF POC: NONE SEEN

## 2021-08-18 LAB — CBC
HCT: 38.3 % (ref 36.0–46.0)
Hemoglobin: 11.8 g/dL — ABNORMAL LOW (ref 12.0–15.0)
MCH: 27.1 pg (ref 26.0–34.0)
MCHC: 30.8 g/dL (ref 30.0–36.0)
MCV: 87.8 fL (ref 80.0–100.0)
Platelets: 297 10*3/uL (ref 150–400)
RBC: 4.36 MIL/uL (ref 3.87–5.11)
RDW: 17.7 % — ABNORMAL HIGH (ref 11.5–15.5)
WBC: 6.8 10*3/uL (ref 4.0–10.5)
nRBC: 0 % (ref 0.0–0.2)

## 2021-08-18 LAB — CHLAMYDIA/NGC RT PCR (ARMC ONLY)
Chlamydia Tr: NOT DETECTED
N gonorrhoeae: NOT DETECTED

## 2021-08-18 LAB — PREGNANCY, URINE: Preg Test, Ur: NEGATIVE

## 2021-08-18 LAB — URINALYSIS, ROUTINE W REFLEX MICROSCOPIC
Bilirubin Urine: NEGATIVE
Glucose, UA: NEGATIVE mg/dL
Hgb urine dipstick: NEGATIVE
Ketones, ur: NEGATIVE mg/dL
Nitrite: NEGATIVE
Protein, ur: 30 mg/dL — AB
Specific Gravity, Urine: 1.009 (ref 1.005–1.030)
pH: 5 (ref 5.0–8.0)

## 2021-08-18 LAB — LIPASE, BLOOD: Lipase: 34 U/L (ref 11–51)

## 2021-08-18 NOTE — ED Triage Notes (Signed)
Pt has low abd pain for 2 days.  Pt has vaginal discharge.  No vag bleeding  denies urinary sx.  Pt alert.

## 2021-08-18 NOTE — Discharge Instructions (Addendum)
Your lab work was all reassuring.  Your test for BV was negative.  Your pregnancy test was negative.  If you continue to have abnormal vaginal discharge please follow-up with your primary care provider.  I would avoid putting any additional boric acid tabs in the vagina as this could be causing more irritation.

## 2021-08-18 NOTE — ED Provider Notes (Signed)
[facility] Provider Note    Event Date/Time   First MD Initiated Contact with Patient 08/18/21 1717     (approximate)   History   Abdominal Pain   HPI  Gina Doyle is a 46 y.o. female past medical history of hypertension CKD peptic ulcer disease who presents with lower abdominal pain and vaginal discharge.  Symptoms been going on for about 2 weeks.  She endorses some intermittent lower abdominal cramping that is suprapubic as well as vaginal discharge and odor.  Has had BV in the past and thought that that could be what is going on so has been using boric acid pills in the vagina which helped briefly but then the abnormal odor has recurred.  She denies dysuria or hematuria.  No fevers or chills.  She is sexually active with 1 partner monogamous.     Past Medical History:  Diagnosis Date   CKD (chronic kidney disease)    GERD (gastroesophageal reflux disease)    History of COVID-19 03/2020   Hypertension    IDA (iron deficiency anemia)    Peptic ulcer disease     Patient Active Problem List   Diagnosis Date Noted   Duodenal ulcer    Multiple polyps of sigmoid colon    Iron deficiency anemia 09/17/2020   Iron deficiency 09/17/2020     Physical Exam  Triage Vital Signs: ED Triage Vitals  Enc Vitals Group     BP 08/18/21 1637 (!) 187/112     Pulse Rate 08/18/21 1637 66     Resp 08/18/21 1637 18     Temp 08/18/21 1637 98.6 F (37 C)     Temp Source 08/18/21 1637 Oral     SpO2 08/18/21 1637 100 %     Weight 08/18/21 1633 (!) 394 lb 10 oz (179 kg)     Height 08/18/21 1633 5\' 9"  (1.753 m)     Head Circumference --      Peak Flow --      Pain Score 08/18/21 1633 6     Pain Loc --      Pain Edu? --      Excl. in [place]? --     Most recent vital signs: Vitals:   08/18/21 1637 08/18/21 1900  BP: (!) 187/112 (!) 194/128  Pulse: 66 72  Resp: 18 16  Temp: 98.6 F (37 C)   SpO2: 100% 100%     General: Awake, no distress.  CV:  Good  peripheral perfusion.  Resp:  Normal effort.  Abd:  No distention.  Mild suprapubic abdominal tenderness abdomen is soft throughout Neuro:             Awake, Alert, Oriented x 3  Other:  Normal appearing cervix with physiologic discharge on pelvic exam no cervical motion tenderness   ED Results / Procedures / Treatments  Labs (all labs ordered are listed, but only abnormal results are displayed) Labs Reviewed  COMPREHENSIVE METABOLIC PANEL - Abnormal; Notable for the following components:      Result Value   Sodium 134 (*)    CO2 18 (*)    BUN 22 (*)    Creatinine, Ser 2.23 (*)    Albumin 3.4 (*)    GFR, Estimated 27 (*)    All other components within normal limits  CBC - Abnormal; Notable for the following components:   Hemoglobin 11.8 (*)    RDW 17.7 (*)    All other components within normal  limits  URINALYSIS, ROUTINE W REFLEX MICROSCOPIC - Abnormal; Notable for the following components:   Color, Urine STRAW (*)    APPearance HAZY (*)    Protein, ur 30 (*)    Leukocytes,Ua TRACE (*)    Bacteria, UA MANY (*)    All other components within normal limits  WET PREP, GENITAL  CHLAMYDIA/NGC RT PCR ([facility] ONLY)            LIPASE, BLOOD  PREGNANCY, URINE     EKG     RADIOLOGY    PROCEDURES:  Critical Care performed: No  Procedures   MEDICATIONS ORDERED IN ED: Medications - No data to display   IMPRESSION / MDM / [place] / ED COURSE  I reviewed the triage vital signs and the nursing notes.                              Patient's presentation is most consistent with acute complicated illness / injury requiring diagnostic workup.  Differential diagnosis includes, but is not limited to, cervicitis, vaginitis, PID, UTI, less likely appendicitis diverticulitis  Patient is a 46 year old female presenting chief complaint of vaginal discharge and odor with some associated abdominal pain.  No frank dysuria.  Vaginal discharge is normal in color but does  have abnormal odor.  No fevers or chills.  Vital signs are notable for hypertension otherwise within normal limits.  Patient appears well abdominal exam is rather benign she has mild tenderness in the suprapubic region but overall abdomen is soft.  Labs are also reassuring, creatinine is near baseline at 2.23 bicarb also low which is near baseline as well at 18.  She has no elevated white count.  I performed a pelvic exam which was reassuring there is no signs of PID or overt signs of cervicitis.  We will send wet prep and GC chlamydia.  Patient is not at risk for STD and exam is not consistent with this so will not treat empirically.  Wet prep is essentially normal.  UA has 6-10 WBCs but significant squames the patient has no real dysuria.  Do not feel this represents true UTI will not treat.  DC chlamydia is pending.  Recommended that she stop using the boric acid tablets as a suspect this may be contributing to her discomfort.  Advise follow-up with PCP.   Clinical Course as of 08/18/21 1914  Tue Aug 18, 2021  1845 Preg Test, Ur: NEGATIVE [KM]    Clinical Course User Index [KM] Rada Hay, MD     FINAL CLINICAL IMPRESSION(S) / ED DIAGNOSES   Final diagnoses:  Vaginal discharge     Rx / DC Orders   ED Discharge Orders     None        Note:  This document was prepared using Dragon voice recognition software and may include unintentional dictation errors.   Rada Hay, MD 08/18/21 [phone]

## 2021-09-03 ENCOUNTER — Ambulatory Visit (INDEPENDENT_AMBULATORY_CARE_PROVIDER_SITE_OTHER): Payer: Self-pay | Admitting: Gastroenterology

## 2021-09-03 ENCOUNTER — Encounter: Payer: Self-pay | Admitting: Gastroenterology

## 2021-09-03 VITALS — BP 149/93 | HR 79 | Temp 98.0°F | Ht 69.0 in | Wt 185.1 lb

## 2021-09-03 DIAGNOSIS — A048 Other specified bacterial intestinal infections: Secondary | ICD-10-CM

## 2021-09-03 DIAGNOSIS — D509 Iron deficiency anemia, unspecified: Secondary | ICD-10-CM

## 2021-10-08 MED FILL — Iron Sucrose Inj 20 MG/ML (Fe Equiv): INTRAVENOUS | Qty: 10 | Status: AC

## 2021-10-09 ENCOUNTER — Inpatient Hospital Stay: Payer: Medicaid Other

## 2021-10-09 ENCOUNTER — Inpatient Hospital Stay: Payer: Medicaid Other | Admitting: Medical Oncology

## 2021-10-09 ENCOUNTER — Ambulatory Visit: Payer: Medicaid Other | Admitting: Internal Medicine

## 2021-10-09 ENCOUNTER — Other Ambulatory Visit: Payer: Self-pay

## 2021-10-09 DIAGNOSIS — E611 Iron deficiency: Secondary | ICD-10-CM

## 2021-10-20 MED FILL — Iron Sucrose Inj 20 MG/ML (Fe Equiv): INTRAVENOUS | Qty: 10 | Status: AC

## 2021-10-21 ENCOUNTER — Inpatient Hospital Stay: Payer: Medicaid Other

## 2021-10-21 ENCOUNTER — Inpatient Hospital Stay (HOSPITAL_BASED_OUTPATIENT_CLINIC_OR_DEPARTMENT_OTHER): Payer: Medicaid Other | Admitting: Internal Medicine

## 2021-10-21 ENCOUNTER — Encounter: Payer: Self-pay | Admitting: Internal Medicine

## 2021-10-21 ENCOUNTER — Inpatient Hospital Stay: Payer: Medicaid Other | Attending: Oncology

## 2021-10-21 DIAGNOSIS — E611 Iron deficiency: Secondary | ICD-10-CM

## 2021-10-21 DIAGNOSIS — Z79899 Other long term (current) drug therapy: Secondary | ICD-10-CM | POA: Insufficient documentation

## 2021-10-21 DIAGNOSIS — N184 Chronic kidney disease, stage 4 (severe): Secondary | ICD-10-CM | POA: Insufficient documentation

## 2021-10-21 DIAGNOSIS — D649 Anemia, unspecified: Secondary | ICD-10-CM | POA: Insufficient documentation

## 2021-10-21 DIAGNOSIS — F17210 Nicotine dependence, cigarettes, uncomplicated: Secondary | ICD-10-CM | POA: Insufficient documentation

## 2021-10-21 LAB — CBC WITH DIFFERENTIAL/PLATELET
Abs Immature Granulocytes: 0.01 10*3/uL (ref 0.00–0.07)
Basophils Absolute: 0 10*3/uL (ref 0.0–0.1)
Basophils Relative: 1 %
Eosinophils Absolute: 0.2 10*3/uL (ref 0.0–0.5)
Eosinophils Relative: 4 %
HCT: 35 % — ABNORMAL LOW (ref 36.0–46.0)
Hemoglobin: 11.2 g/dL — ABNORMAL LOW (ref 12.0–15.0)
Immature Granulocytes: 0 %
Lymphocytes Relative: 35 %
Lymphs Abs: 1.9 10*3/uL (ref 0.7–4.0)
MCH: 27.9 pg (ref 26.0–34.0)
MCHC: 32 g/dL (ref 30.0–36.0)
MCV: 87.1 fL (ref 80.0–100.0)
Monocytes Absolute: 0.5 10*3/uL (ref 0.1–1.0)
Monocytes Relative: 9 %
Neutro Abs: 2.8 10*3/uL (ref 1.7–7.7)
Neutrophils Relative %: 51 %
Platelets: 363 10*3/uL (ref 150–400)
RBC: 4.02 MIL/uL (ref 3.87–5.11)
RDW: 17.4 % — ABNORMAL HIGH (ref 11.5–15.5)
WBC: 5.4 10*3/uL (ref 4.0–10.5)
nRBC: 0 % (ref 0.0–0.2)

## 2021-10-21 LAB — BASIC METABOLIC PANEL
Anion gap: 4 — ABNORMAL LOW (ref 5–15)
BUN: 19 mg/dL (ref 6–20)
CO2: 20 mmol/L — ABNORMAL LOW (ref 22–32)
Calcium: 9.9 mg/dL (ref 8.9–10.3)
Chloride: 113 mmol/L — ABNORMAL HIGH (ref 98–111)
Creatinine, Ser: 1.97 mg/dL — ABNORMAL HIGH (ref 0.44–1.00)
GFR, Estimated: 31 mL/min — ABNORMAL LOW (ref >60)
Glucose, Bld: 88 mg/dL (ref 70–99)
Potassium: 4.8 mmol/L (ref 3.5–5.1)
Sodium: 137 mmol/L (ref 135–145)

## 2021-10-21 LAB — IRON AND TIBC
Iron: 45 ug/dL (ref 28–170)
Saturation Ratios: 12 % (ref 10.4–31.8)
TIBC: 371 ug/dL (ref 250–450)
UIBC: 326 ug/dL

## 2021-10-21 LAB — FERRITIN: Ferritin: 6 ng/mL — ABNORMAL LOW (ref 11–307)

## 2021-10-21 NOTE — Assessment & Plan Note (Addendum)
#  Iron deficient anemia [ferritin 3.5/hemoglobin 8.2-June 2022; [facility] neph[facility]]. S/p IV iron infusion- improved Hb 11.8; HOLD iron infusion.  Recommend RE-starting p.o. iron pills/gentle iron [Hx of constipation].   #Elevated blood pressure-150-140s at home. again recommend compliance with her blood pressure medications; close follow-up with nephrology.  Recently lost to follow-up [facility].  As per patient request we will make a referral to nephrology locally.  #Active smoker:counselled to quit smoking/cutting back [3 cig/day]  # DISPOSITION: # referral to neprhology/ [facility] re: CKD stage III # HOLD venofer today # follow up in 6 months MD; labs- cbc/bmp; Iron studies/ferritin; possible venfoer--Dr.B

## 2021-10-21 NOTE — Progress Notes (Signed)
[place] NOTE  Patient Care Team: Donnie Coffin, MD as PCP - General (Family Medicine) Cammie Sickle, MD as Consulting Physician (Internal Medicine)  CHIEF COMPLAINTS/PURPOSE OF CONSULTATION: ANEMIA   HEMATOLOGY HISTORY:  # ANEMIA [June 2022- Ferritin-3.5; Hb- 8.3; MCv-73] EGD/Colonoscopy:none [awaiting GI EGD/ re: stomach ulcers]; Vaginal bleeding: previous heavy [3-4 months]- June 2022- CT A/P- Motion degraded.  No acute abnormality identified; Few small foci of lucency within the iliac bones. Indeterminate but without aggressive features. Could be related to CKD.   # CKD stage IV [[place] ~2010]/Proteinuria [No Kidney Bx]; Poorly controlled HTN; Hx of peptic ulcer [awaiting GI]   HISTORY OF PRESENTING ILLNESS: Alone.  Walking independently.  Gina Doyle 46 y.o.  female with above history of chronic kidney disease -stage IV/ severe anemia s/p IV venofer is here for a follow up.  Patient denies any blood in stools or black-colored stools.  No nausea no vomiting.  She is on gentle p.o. iron- but admits to non-compliance.   Review of Systems  Constitutional:  Positive for malaise/fatigue. Negative for chills, diaphoresis, fever and weight loss.  HENT:  Negative for nosebleeds and sore throat.   Eyes:  Negative for double vision.  Respiratory:  Negative for cough, hemoptysis, sputum production, shortness of breath and wheezing.   Cardiovascular:  Negative for chest pain, palpitations, orthopnea and leg swelling.  Gastrointestinal:  Positive for abdominal pain. Negative for blood in stool, constipation, diarrhea, heartburn, melena, nausea and vomiting.  Genitourinary:  Negative for dysuria, frequency and urgency.  Musculoskeletal:  Positive for back pain. Negative for joint pain.  Skin: Negative.  Negative for itching and rash.  Neurological:  Negative for dizziness, tingling, focal weakness, weakness and headaches.  Endo/Heme/Allergies:  Does not  bruise/bleed easily.  Psychiatric/Behavioral:  Negative for depression. The patient is not nervous/anxious and does not have insomnia.     MEDICAL HISTORY:  Past Medical History:  Diagnosis Date   CKD (chronic kidney disease)    GERD (gastroesophageal reflux disease)    History of COVID-19 03/2020   Hypertension    IDA (iron deficiency anemia)    Peptic ulcer disease     SURGICAL HISTORY: Past Surgical History:  Procedure Laterality Date   COLONOSCOPY WITH PROPOFOL N/A 05/26/2021   Procedure: COLONOSCOPY WITH PROPOFOL;  Surgeon: Lin Landsman, MD;  Location: [place];  Service: Endoscopy;  Laterality: N/A;   ESOPHAGOGASTRODUODENOSCOPY (EGD) WITH PROPOFOL N/A 05/26/2021   Procedure: ESOPHAGOGASTRODUODENOSCOPY (EGD) WITH PROPOFOL;  Surgeon: Lin Landsman, MD;  Location: [place];  Service: Endoscopy;  Laterality: N/A;    SOCIAL HISTORY: Social History   Socioeconomic History   Marital status: Single    Spouse name: Not on file   Number of children: Not on file   Years of education: Not on file   Highest education level: Not on file  Occupational History   Not on file  Tobacco Use   Smoking status: Every Day    Packs/day: 0.25    Years: 14.00    Total pack years: 3.50    Types: Cigarettes   Smokeless tobacco: Never   Tobacco comments:    Started smoking age 34.  No cigs since 05/13/21  Vaping Use   Vaping Use: Never used  Substance and Sexual Activity   Alcohol use: Not Currently   Drug use: Not Currently   Sexual activity: Not on file  Other Topics Concern   Not on file  Social History Narrative  Lives in [place] with son. Smoke 5- cig/day; No alcohol; works at [employer].    Social Determinants of Health   Financial Resource Strain: Not on file  Food Insecurity: Not on file  Transportation Needs: Not on file  Physical Activity: Not on file  Stress: Not on file  Social Connections: Not on file  Intimate Partner Violence: Not on  file    FAMILY HISTORY: Family History  Problem Relation Age of Onset   Cancer Maternal Grandfather    Cancer Paternal Grandmother    Pancreatic cancer Paternal Grandmother     ALLERGIES:  has No Known Allergies.  MEDICATIONS:  Current Outpatient Medications  Medication Sig Dispense Refill   cyclobenzaprine (FLEXERIL) 5 MG tablet Take 1 tablet (5 mg total) by mouth 3 (three) times daily as needed for muscle spasms (back pain). 20 tablet 0   losartan-hydrochlorothiazide (HYZAAR) 100-25 MG tablet Take 1 tablet by mouth daily.     omeprazole (PRILOSEC) 40 MG capsule Take 1 capsule (40 mg total) by mouth 2 (two) times daily before a meal. 60 capsule 2   No current facility-administered medications for this visit.      PHYSICAL EXAMINATION:   Vitals:   10/21/21 1400  BP: (!) 158/99  Pulse: (!) 46  Resp: 18  Temp: 99 F (37.2 C)  SpO2: 100%   Filed Weights   10/21/21 1400  Weight: 184 lb 3.2 oz (83.6 kg)    Physical Exam Vitals and nursing note reviewed.  Constitutional:      Comments: Ambulating: independently;   Alone  HENT:     Head: Normocephalic and atraumatic.     Mouth/Throat:     Pharynx: Oropharynx is clear.  Eyes:     Extraocular Movements: Extraocular movements intact.     Pupils: Pupils are equal, round, and reactive to light.  Cardiovascular:     Rate and Rhythm: Normal rate and regular rhythm.  Pulmonary:     Comments: Decreased breath sounds bilaterally.  Abdominal:     Palpations: Abdomen is soft.  Musculoskeletal:        General: Normal range of motion.     Cervical back: Normal range of motion.  Skin:    General: Skin is warm.  Neurological:     General: No focal deficit present.     Mental Status: She is alert and oriented to person, place, and time.  Psychiatric:        Behavior: Behavior normal.        Judgment: Judgment normal.     LABORATORY DATA:  I have reviewed the data as listed Lab Results  Component Value Date   WBC  5.4 10/21/2021   HGB 11.2 (L) 10/21/2021   HCT 35.0 (L) 10/21/2021   MCV 87.1 10/21/2021   PLT 363 10/21/2021   Recent Labs    12/10/20 0942 04/10/21 1032 05/17/21 2012 08/18/21 1635 10/21/21 1357  NA 137   < > 136 134* 137  K 4.0   < > 4.0 4.3 4.8  CL 110   < > 111 109 113*  CO2 23   < > 19* 18* 20*  GLUCOSE 103*   < > 84 97 88  BUN 15   < > 17 22* 19  CREATININE 2.20*   < > 2.32* 2.23* 1.97*  CALCIUM 10.0   < > 10.0 9.7 9.9  GFRNONAA 27*   < > 26* 27* 31*  PROT 7.3  --   --  7.6  --  ALBUMIN 3.4*  --   --  3.4*  --   AST 20  --   --  24  --   ALT 16  --   --  17  --   ALKPHOS 82  --   --  106  --   BILITOT 0.6  --   --  0.4  --    < > = values in this interval not displayed.     No results found.  Symptomatic anemia #Iron deficient anemia [ferritin 3.5/hemoglobin 8.2-June 2022; [facility]]. S/p IV iron infusion- improved Hb 11.8; HOLD iron infusion.  Recommend RE-starting p.o. iron pills/gentle iron [Hx of constipation].   #Elevated blood pressure-150-140s at home. again recommend compliance with her blood pressure medications; close follow-up with nephrology.  Recently lost to follow-up [facility].  As per patient request we will make a referral to nephrology locally.  #Active smoker:counselled to quit smoking/cutting back [3 cig/day]  # DISPOSITION: # referral to neprhology/ [facility] re: CKD stage III # HOLD venofer today # follow up in 6 months MD; labs- cbc/bmp; Iron studies/ferritin; possible venfoer--Dr.B    All questions were answered. The patient knows to call the clinic with any problems, questions or concerns.    Cammie Sickle, MD 10/23/2021 7:45 PM

## 2021-10-21 NOTE — Progress Notes (Signed)
Patient reports watery diarrhea for the past 4-5 days.  For the past 3 days having episodes of SOBr with scapula pain.  Occasional neuropathy in toes.  BP 158/99, HR 46 (states she has been struggling with bp recently)

## 2021-10-23 ENCOUNTER — Encounter: Payer: Self-pay | Admitting: Internal Medicine

## 2022-01-14 IMAGING — CT CT ABD-PELV W/O CM
2 of 8 series · 17 of 46 positions shown, 19 images · non-contrast
Comparison: None.

CLINICAL DATA: Abdominal pain, epigastric pain

EXAM:
CT ABDOMEN AND PELVIS WITHOUT CONTRAST
TECHNIQUE: Multidetector CT imaging of the abdomen and pelvis was performed
following the standard protocol without IV contrast.

[Series 2: axial st · axial · 0.82mm/px · z∈[-294,+86]mm · 14 of 88 slices shown, 16 images]
[im 6/88  soft-tissue]
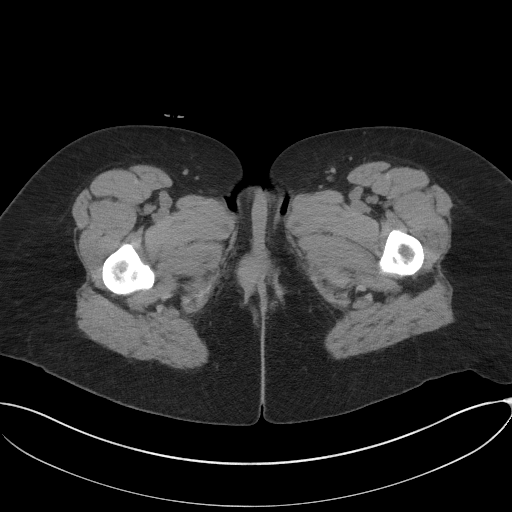
[im 6/88  bone]
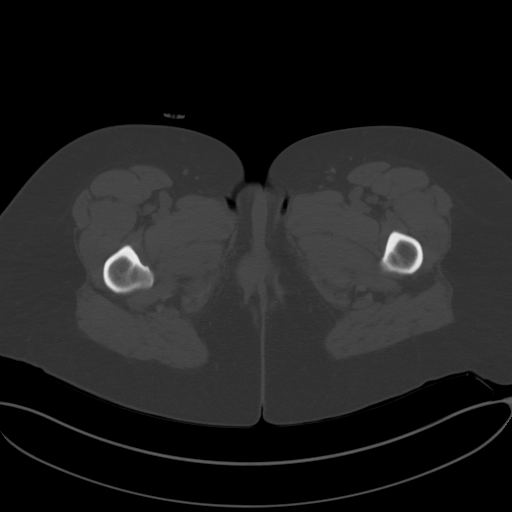
[im 12/88  soft-tissue]
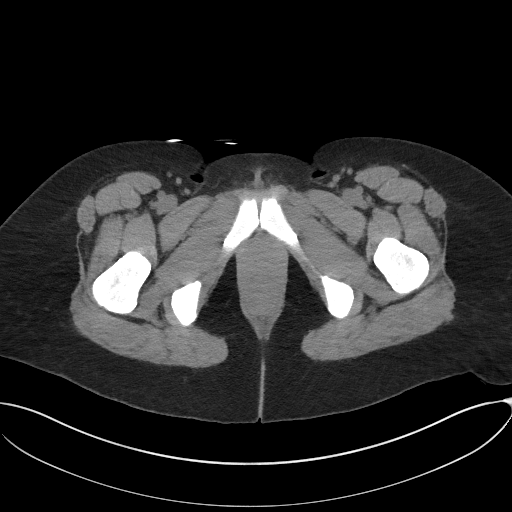
[im 18/88  soft-tissue]
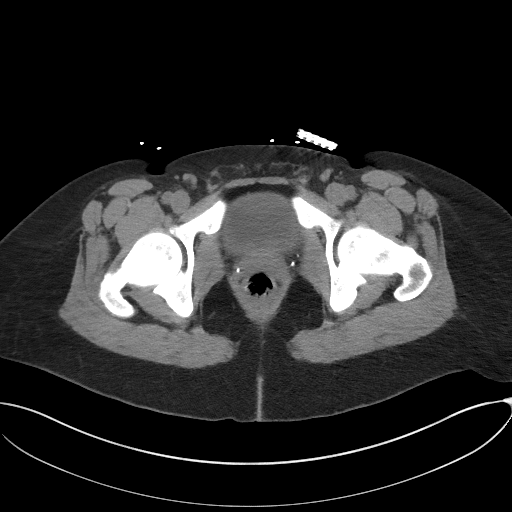
[im 24/88  soft-tissue]
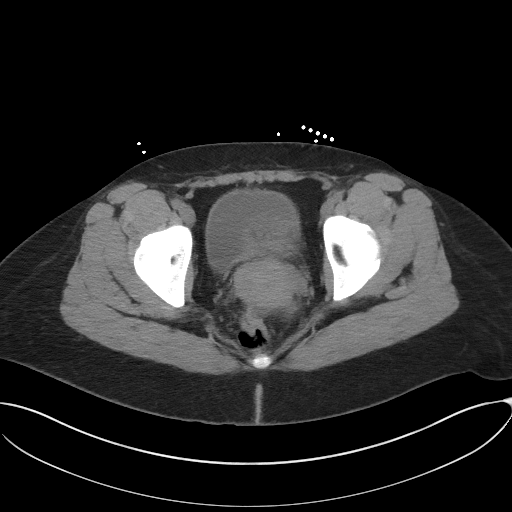
[im 30/88  soft-tissue]
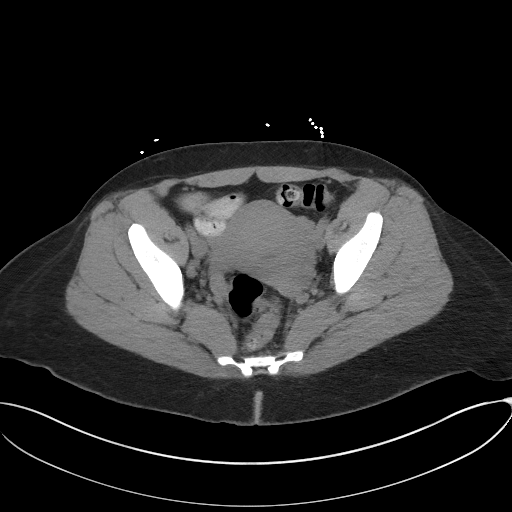
[im 35/88  soft-tissue]
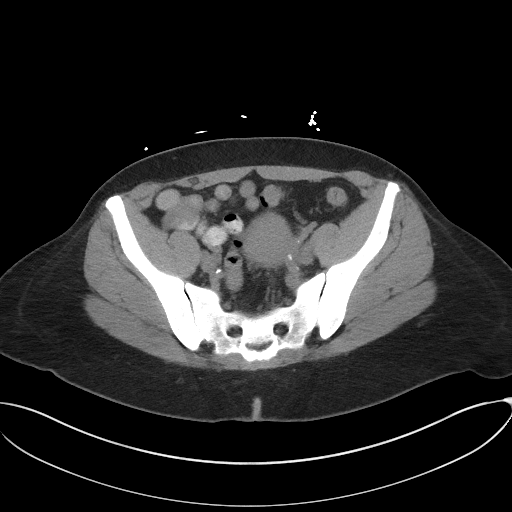
[im 41/88  soft-tissue]
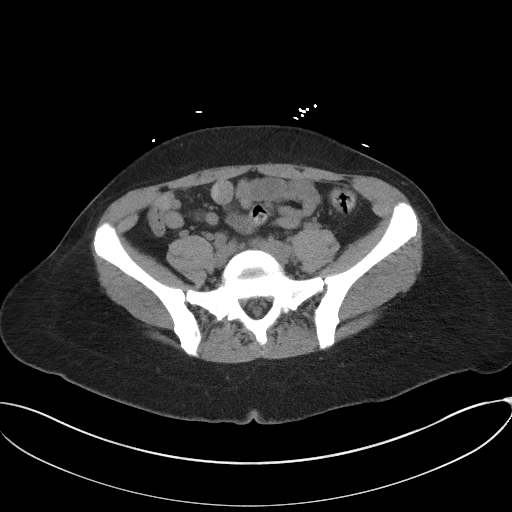
[im 47/88  soft-tissue]
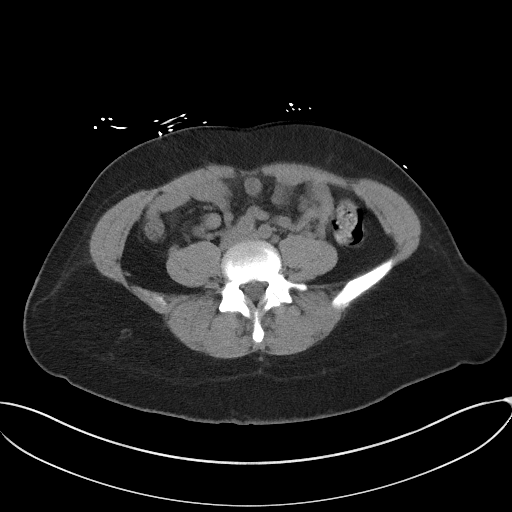
[im 53/88  soft-tissue]
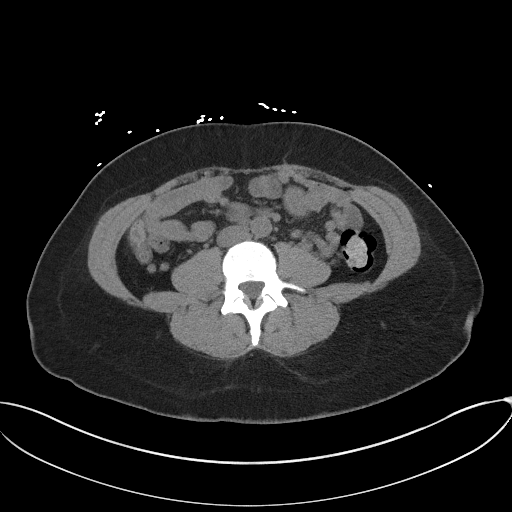
[im 53/88  bone]
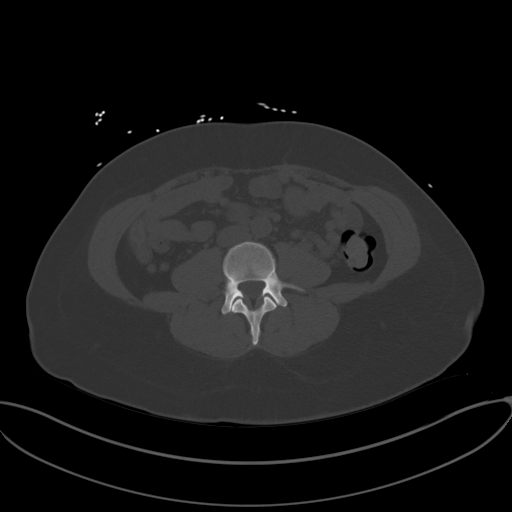
[im 59/88  soft-tissue]
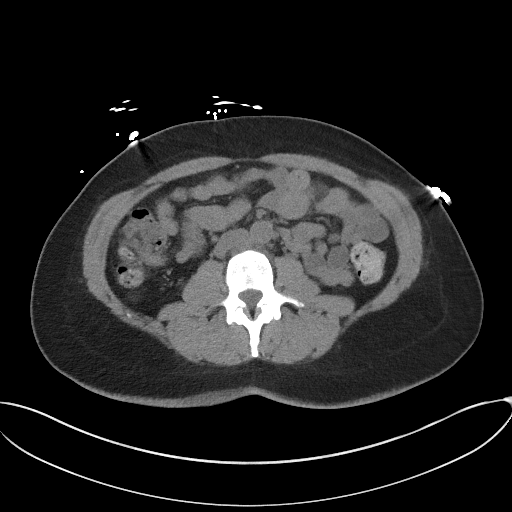
[im 64/88  soft-tissue]
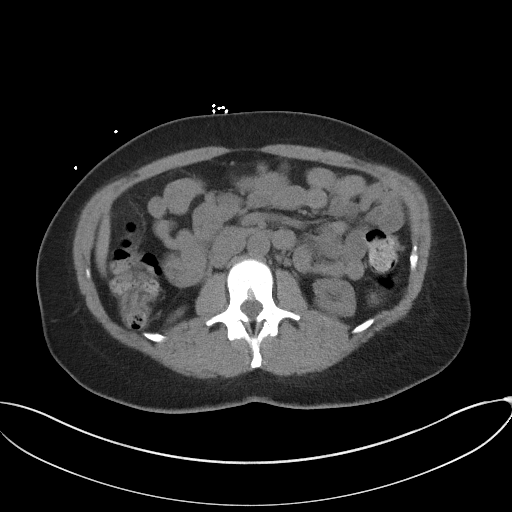
[im 70/88  soft-tissue]
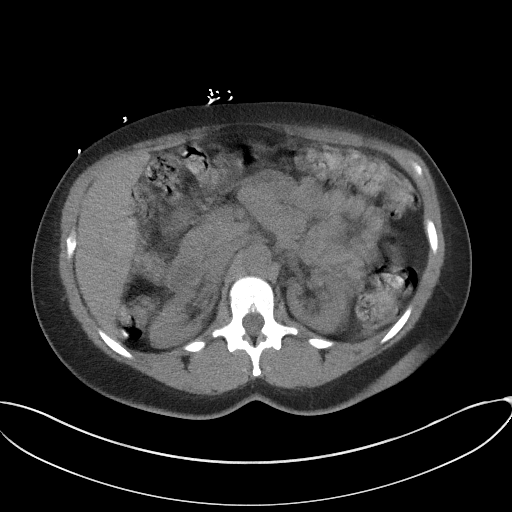
[im 76/88  soft-tissue]
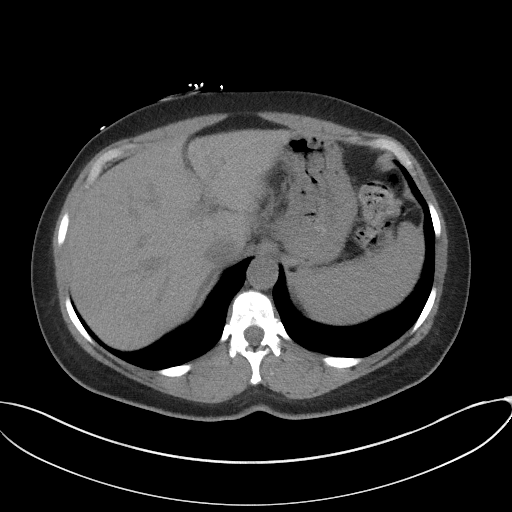
[im 82/88  soft-tissue]
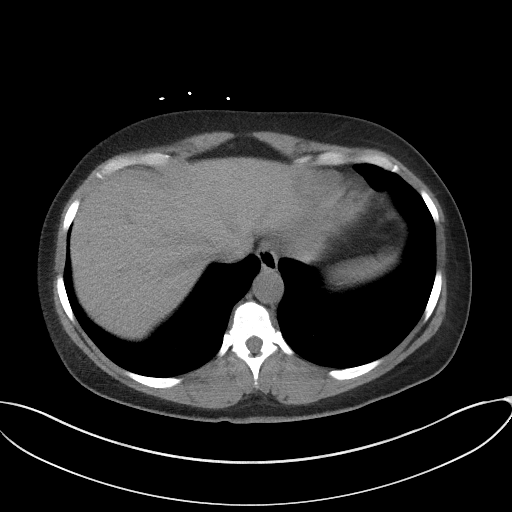

[Series 8: coronal st · coronal · 0.87mm/px · 3 of 86 slices shown]
[im 22/86  soft-tissue]
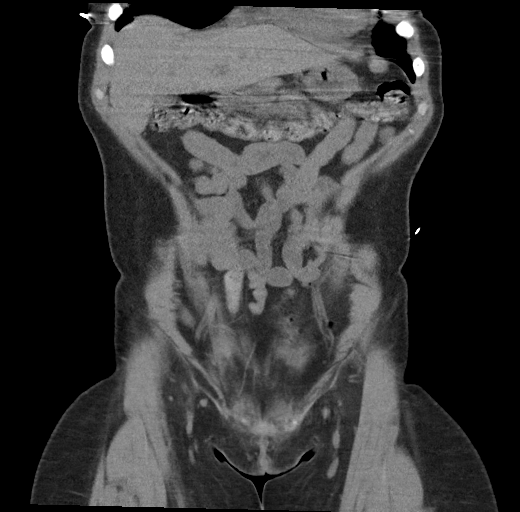
[im 43/86  soft-tissue]
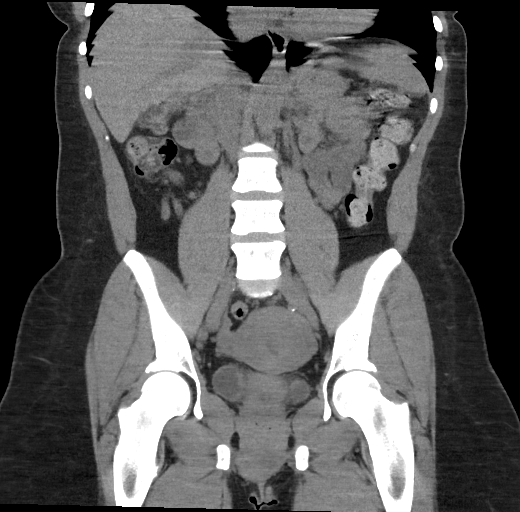
[im 64/86  soft-tissue]
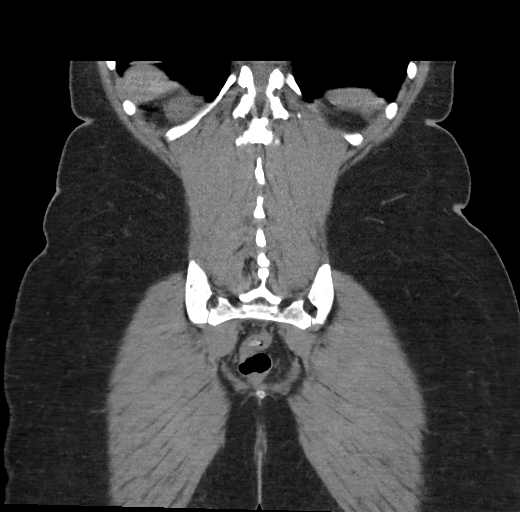

[17 of 46 positions shown; findings below may reference images not displayed]

FINDINGS: Motion artifact is present. Below findings are within this
limitation.

Lower chest: No acute abnormality.

Hepatobiliary: No focal liver abnormality. Gallbladder poorly
evaluated due to artifact.

Pancreas: Unremarkable.

Spleen: Unremarkable.

Adrenals/Urinary Tract: Adrenals are not well evaluated. Bilateral
renal atrophy. Bladder is unremarkable.

Stomach/Bowel: Stomach is within normal limits. Bowel is normal in
caliber. Normal appendix.

Vascular/Lymphatic: Minimal aortic atherosclerosis. No enlarged
lymph nodes.

Reproductive: Uterus and bilateral adnexa are unremarkable.

Other: Trace free fluid in the pelvis is probably physiologic.
Abdominal wall is unremarkable.

Musculoskeletal: No acute osseous abnormality. There are few small
foci of lucency within the iliac bones without aggressive features.
IMPRESSION: Motion degraded.  No acute abnormality identified.

Few small foci of lucency within the iliac bones. Indeterminate but
without aggressive features. Could be related to chronic renal
disease.

## 2022-04-16 ENCOUNTER — Encounter: Payer: Self-pay | Admitting: Internal Medicine

## 2022-04-22 MED FILL — Iron Sucrose Inj 20 MG/ML (Fe Equiv): INTRAVENOUS | Qty: 10 | Status: AC

## 2022-04-23 ENCOUNTER — Inpatient Hospital Stay: Payer: Medicaid Other

## 2022-04-23 ENCOUNTER — Encounter: Payer: Self-pay | Admitting: Internal Medicine

## 2022-04-23 ENCOUNTER — Inpatient Hospital Stay (HOSPITAL_BASED_OUTPATIENT_CLINIC_OR_DEPARTMENT_OTHER): Payer: Medicaid Other | Admitting: Internal Medicine

## 2022-04-23 ENCOUNTER — Inpatient Hospital Stay: Payer: Medicaid Other | Attending: Internal Medicine

## 2022-04-23 VITALS — BP 150/108 | HR 72 | Temp 97.4°F | Resp 14 | Wt 186.0 lb

## 2022-04-23 DIAGNOSIS — D649 Anemia, unspecified: Secondary | ICD-10-CM | POA: Insufficient documentation

## 2022-04-23 DIAGNOSIS — Z79899 Other long term (current) drug therapy: Secondary | ICD-10-CM | POA: Insufficient documentation

## 2022-04-23 DIAGNOSIS — F17210 Nicotine dependence, cigarettes, uncomplicated: Secondary | ICD-10-CM | POA: Insufficient documentation

## 2022-04-23 DIAGNOSIS — E611 Iron deficiency: Secondary | ICD-10-CM

## 2022-04-23 DIAGNOSIS — D509 Iron deficiency anemia, unspecified: Secondary | ICD-10-CM | POA: Insufficient documentation

## 2022-04-23 DIAGNOSIS — N184 Chronic kidney disease, stage 4 (severe): Secondary | ICD-10-CM | POA: Diagnosis not present

## 2022-04-23 DIAGNOSIS — I129 Hypertensive chronic kidney disease with stage 1 through stage 4 chronic kidney disease, or unspecified chronic kidney disease: Secondary | ICD-10-CM | POA: Insufficient documentation

## 2022-04-23 DIAGNOSIS — M542 Cervicalgia: Secondary | ICD-10-CM | POA: Insufficient documentation

## 2022-04-23 LAB — CBC WITH DIFFERENTIAL/PLATELET
Abs Immature Granulocytes: 0.01 10*3/uL (ref 0.00–0.07)
Basophils Absolute: 0 10*3/uL (ref 0.0–0.1)
Basophils Relative: 1 %
Eosinophils Absolute: 0.2 10*3/uL (ref 0.0–0.5)
Eosinophils Relative: 4 %
HCT: 39.5 % (ref 36.0–46.0)
Hemoglobin: 12.5 g/dL (ref 12.0–15.0)
Immature Granulocytes: 0 %
Lymphocytes Relative: 28 %
Lymphs Abs: 1.4 10*3/uL (ref 0.7–4.0)
MCH: 27.6 pg (ref 26.0–34.0)
MCHC: 31.6 g/dL (ref 30.0–36.0)
MCV: 87.2 fL (ref 80.0–100.0)
Monocytes Absolute: 0.4 10*3/uL (ref 0.1–1.0)
Monocytes Relative: 7 %
Neutro Abs: 2.9 10*3/uL (ref 1.7–7.7)
Neutrophils Relative %: 60 %
Platelets: 348 10*3/uL (ref 150–400)
RBC: 4.53 MIL/uL (ref 3.87–5.11)
RDW: 16.8 % — ABNORMAL HIGH (ref 11.5–15.5)
WBC: 4.9 10*3/uL (ref 4.0–10.5)
nRBC: 0 % (ref 0.0–0.2)

## 2022-04-23 LAB — BASIC METABOLIC PANEL
Anion gap: 6 (ref 5–15)
BUN: 31 mg/dL — ABNORMAL HIGH (ref 6–20)
CO2: 22 mmol/L (ref 22–32)
Calcium: 10.2 mg/dL (ref 8.9–10.3)
Chloride: 107 mmol/L (ref 98–111)
Creatinine, Ser: 2.19 mg/dL — ABNORMAL HIGH (ref 0.44–1.00)
GFR, Estimated: 27 mL/min — ABNORMAL LOW (ref >60)
Glucose, Bld: 92 mg/dL (ref 70–99)
Potassium: 4.4 mmol/L (ref 3.5–5.1)
Sodium: 135 mmol/L (ref 135–145)

## 2022-04-23 LAB — IRON AND TIBC
Iron: 88 ug/dL (ref 28–170)
Saturation Ratios: 20 % (ref 10.4–31.8)
TIBC: 433 ug/dL (ref 250–450)
UIBC: 345 ug/dL

## 2022-04-23 LAB — FERRITIN: Ferritin: 7 ng/mL — ABNORMAL LOW (ref 11–307)

## 2022-04-23 NOTE — Assessment & Plan Note (Addendum)
#  Iron deficient anemia [ferritin 3.5/hemoglobin 8.2-June 2022; [facility]]. S/p IV iron infusion- improved. June 2023- iron sat-12%; ferritin-6  # Hb 12.5-; HOLD iron infusion.  Continue starting p.o. iron pills/gentle iron [Hx of constipation].   # CKD stage IV- GFR-stable.   # Left sided neck pain- ? Cervical nerve impingement. Defer to PCP.   #Elevated blood pressure-150-140s at home. again recommend compliance with her blood pressure medications; close follow-up with nephrology. And again, recommend follow up with nephology.   #Active smoker:counselled to quit smoking/cutting back [3 cig/day]  # DISPOSITION: # HOLD venofer today # follow up in 6 months MD; labs- cbc/bmp; Iron studies/ferritin; possible venfoer--Dr.B

## 2022-04-23 NOTE — Progress Notes (Signed)
Pt in for follow up, Reports energy are "ok".  Pt states she has had a stiff neck with radiating headaches on left side of head for a few months.

## 2022-04-23 NOTE — Progress Notes (Signed)
[place] NOTE  Patient Care Team: Donnie Coffin, MD as PCP - General (Family Medicine) Cammie Sickle, MD as Consulting Physician (Internal Medicine)  CHIEF COMPLAINTS/PURPOSE OF CONSULTATION: ANEMIA   HEMATOLOGY HISTORY:  # ANEMIA [June 2022- Ferritin-3.5; Hb- 8.3; MCv-73] EGD/Colonoscopy:none [awaiting GI EGD/ re: stomach ulcers]; Vaginal bleeding: previous heavy [3-4 months]- June 2022- CT A/P- Motion degraded.  No acute abnormality identified; Few small foci of lucency within the iliac bones. Indeterminate but without aggressive features. Could be related to CKD.   # CKD stage IV [[place] ~2010]/Proteinuria [No Kidney Bx]; Poorly controlled HTN; Hx of peptic ulcer [awaiting GI]   HISTORY OF PRESENTING ILLNESS: Alone.  Walking independently.  Gina Doyle 47 y.o.  female with above history of chronic kidney disease -stage IV/ severe anemia s/p IV venofer is here for a follow up.  Pt in for follow up, Reports energy are "ok". Pt states she has had a stiff neck with radiating headaches on left side of head for a few months   Patient denies any blood in stools or black-colored stools.  No nausea no vomiting.  She is on gentle p.o. iron- but admits to non-compliance.   Review of Systems  Constitutional:  Positive for malaise/fatigue. Negative for chills, diaphoresis, fever and weight loss.  HENT:  Negative for nosebleeds and sore throat.   Eyes:  Negative for double vision.  Respiratory:  Negative for cough, hemoptysis, sputum production, shortness of breath and wheezing.   Cardiovascular:  Negative for chest pain, palpitations, orthopnea and leg swelling.  Gastrointestinal:  Positive for abdominal pain. Negative for blood in stool, constipation, diarrhea, heartburn, melena, nausea and vomiting.  Genitourinary:  Negative for dysuria, frequency and urgency.  Musculoskeletal:  Positive for back pain. Negative for joint pain.  Skin: Negative.   Negative for itching and rash.  Neurological:  Negative for dizziness, tingling, focal weakness, weakness and headaches.  Endo/Heme/Allergies:  Does not bruise/bleed easily.  Psychiatric/Behavioral:  Negative for depression. The patient is not nervous/anxious and does not have insomnia.     MEDICAL HISTORY:  Past Medical History:  Diagnosis Date   CKD (chronic kidney disease)    GERD (gastroesophageal reflux disease)    History of COVID-19 03/2020   Hypertension    IDA (iron deficiency anemia)    Peptic ulcer disease     SURGICAL HISTORY: Past Surgical History:  Procedure Laterality Date   COLONOSCOPY WITH PROPOFOL N/A 05/26/2021   Procedure: COLONOSCOPY WITH PROPOFOL;  Surgeon: Lin Landsman, MD;  Location: [place];  Service: Endoscopy;  Laterality: N/A;   ESOPHAGOGASTRODUODENOSCOPY (EGD) WITH PROPOFOL N/A 05/26/2021   Procedure: ESOPHAGOGASTRODUODENOSCOPY (EGD) WITH PROPOFOL;  Surgeon: Lin Landsman, MD;  Location: [place];  Service: Endoscopy;  Laterality: N/A;    SOCIAL HISTORY: Social History   Socioeconomic History   Marital status: Single    Spouse name: Not on file   Number of children: Not on file   Years of education: Not on file   Highest education level: Not on file  Occupational History   Not on file  Tobacco Use   Smoking status: Every Day    Packs/day: 0.25    Years: 14.00    Total pack years: 3.50    Types: Cigarettes   Smokeless tobacco: Never   Tobacco comments:    Started smoking age 63.  No cigs since 05/13/21  Vaping Use   Vaping Use: Never used  Substance and Sexual Activity  Alcohol use: Not Currently   Drug use: Not Currently   Sexual activity: Not on file  Other Topics Concern   Not on file  Social History Narrative   Lives in [place] with son. Smoke 5- cig/day; No alcohol; works at [employer].    Social Determinants of Health   Financial Resource Strain: Not on file  Food Insecurity: Not on file   Transportation Needs: Not on file  Physical Activity: Not on file  Stress: Not on file  Social Connections: Not on file  Intimate Partner Violence: Not on file    FAMILY HISTORY: Family History  Problem Relation Age of Onset   Cancer Maternal Grandfather    Cancer Paternal Grandmother    Pancreatic cancer Paternal Grandmother     ALLERGIES:  has No Known Allergies.  MEDICATIONS:  Current Outpatient Medications  Medication Sig Dispense Refill   cyclobenzaprine (FLEXERIL) 5 MG tablet Take 1 tablet (5 mg total) by mouth 3 (three) times daily as needed for muscle spasms (back pain). 20 tablet 0   losartan-hydrochlorothiazide (HYZAAR) 100-25 MG tablet Take 1 tablet by mouth daily.     omeprazole (PRILOSEC) 40 MG capsule Take 1 capsule (40 mg total) by mouth 2 (two) times daily before a meal. 60 capsule 2   No current facility-administered medications for this visit.      PHYSICAL EXAMINATION:   Vitals:   04/23/22 1339  BP: (!) 150/108  Pulse: 72  Resp: 14  Temp: (!) 97.4 F (36.3 C)  SpO2: 100%   Filed Weights   04/23/22 1339  Weight: 186 lb (84.4 kg)    Physical Exam Vitals and nursing note reviewed.  Constitutional:      Comments: Ambulating: independently;   Alone  HENT:     Head: Normocephalic and atraumatic.     Mouth/Throat:     Pharynx: Oropharynx is clear.  Eyes:     Extraocular Movements: Extraocular movements intact.     Pupils: Pupils are equal, round, and reactive to light.  Cardiovascular:     Rate and Rhythm: Normal rate and regular rhythm.  Pulmonary:     Comments: Decreased breath sounds bilaterally.  Abdominal:     Palpations: Abdomen is soft.  Musculoskeletal:        General: Normal range of motion.     Cervical back: Normal range of motion.  Skin:    General: Skin is warm.  Neurological:     General: No focal deficit present.     Mental Status: She is alert and oriented to person, place, and time.  Psychiatric:        Behavior:  Behavior normal.        Judgment: Judgment normal.     LABORATORY DATA:  I have reviewed the data as listed Lab Results  Component Value Date   WBC 4.9 04/23/2022   HGB 12.5 04/23/2022   HCT 39.5 04/23/2022   MCV 87.2 04/23/2022   PLT 348 04/23/2022   Recent Labs    08/18/21 1635 10/21/21 1357 04/23/22 1245  NA 134* 137 135  K 4.3 4.8 4.4  CL 109 113* 107  CO2 18* 20* 22  GLUCOSE 97 88 92  BUN 22* 19 31*  CREATININE 2.23* 1.97* 2.19*  CALCIUM 9.7 9.9 10.2  GFRNONAA 27* 31* 27*  PROT 7.6  --   --   ALBUMIN 3.4*  --   --   AST 24  --   --   ALT 17  --   --  ALKPHOS 106  --   --   BILITOT 0.4  --   --      No results found.  Symptomatic anemia #Iron deficient anemia [ferritin 3.5/hemoglobin 8.2-June 2022; [facility]]. S/p IV iron infusion- improved. June 2023- iron sat-12%; ferritin-6  # Hb 12.5-; HOLD iron infusion.  Continue starting p.o. iron pills/gentle iron [Hx of constipation].   # CKD stage IV- GFR-stable.   # Left sided neck pain- ? Cervical nerve impingement. Defer to PCP.   #Elevated blood pressure-150-140s at home. again recommend compliance with her blood pressure medications; close follow-up with nephrology. And again, recommend follow up with nephology.   #Active smoker:counselled to quit smoking/cutting back [3 cig/day]  # DISPOSITION: # HOLD venofer today # follow up in 6 months MD; labs- cbc/bmp; Iron studies/ferritin; possible venfoer--Dr.B    All questions were answered. The patient knows to call the clinic with any problems, questions or concerns.    Cammie Sickle, MD 04/23/2022 3:23 PM

## 2022-05-13 ENCOUNTER — Encounter: Payer: Self-pay | Admitting: Emergency Medicine

## 2022-05-13 ENCOUNTER — Other Ambulatory Visit: Payer: Self-pay

## 2022-05-13 ENCOUNTER — Emergency Department: Payer: Medicaid Other

## 2022-05-13 ENCOUNTER — Emergency Department
Admission: EM | Admit: 2022-05-13 | Discharge: 2022-05-13 | Disposition: A | Discharge status: Home / Self Care | Payer: Medicaid Other | Attending: Student in an Organized Health Care Education/Training Program | Admitting: Student in an Organized Health Care Education/Training Program

## 2022-05-13 DIAGNOSIS — M5459 Other low back pain: Secondary | ICD-10-CM | POA: Diagnosis not present

## 2022-05-13 DIAGNOSIS — N3090 Cystitis, unspecified without hematuria: Secondary | ICD-10-CM | POA: Insufficient documentation

## 2022-05-13 DIAGNOSIS — N3000 Acute cystitis without hematuria: Secondary | ICD-10-CM | POA: Diagnosis not present

## 2022-05-13 DIAGNOSIS — M545 Low back pain, unspecified: Secondary | ICD-10-CM | POA: Diagnosis not present

## 2022-05-13 LAB — URINALYSIS, ROUTINE W REFLEX MICROSCOPIC
Bilirubin Urine: NEGATIVE
Glucose, UA: NEGATIVE mg/dL
Hgb urine dipstick: NEGATIVE
Ketones, ur: NEGATIVE mg/dL
Nitrite: NEGATIVE
Protein, ur: 100 mg/dL — AB
Specific Gravity, Urine: 1.011 (ref 1.005–1.030)
pH: 6 (ref 5.0–8.0)

## 2022-05-13 LAB — POC URINE PREG, ED: Preg Test, Ur: NEGATIVE

## 2022-05-13 MED ORDER — LIDOCAINE 5 % EX PTCH
1.0000 | MEDICATED_PATCH | CUTANEOUS | Status: DC
Start: 2022-05-13 — End: 2022-05-13
  Administered 2022-05-13: 1 via TRANSDERMAL
  Filled 2022-05-13: qty 1

## 2022-05-13 MED ORDER — PREDNISONE 10 MG (21) PO TBPK
ORAL_TABLET | ORAL | 0 refills | Status: DC
Start: 2022-05-13 — End: 2022-10-27

## 2022-05-13 MED ORDER — MORPHINE SULFATE (PF) 4 MG/ML IV SOLN
5.0000 mg | Freq: Once | INTRAVENOUS | Status: AC
  Administered 2022-05-13: 5 mg via INTRAMUSCULAR
  Filled 2022-05-13: qty 2

## 2022-05-13 MED ORDER — LIDOCAINE 5 % EX PTCH: 1.0000 | MEDICATED_PATCH | Freq: Two times a day (BID) | CUTANEOUS | 0 refills | Status: AC

## 2022-05-13 MED ORDER — DEXAMETHASONE SODIUM PHOSPHATE 10 MG/ML IJ SOLN
10.0000 mg | Freq: Once | INTRAMUSCULAR | Status: AC
  Administered 2022-05-13: 10 mg via INTRAMUSCULAR
  Filled 2022-05-13: qty 1

## 2022-05-13 MED ORDER — CEFDINIR 300 MG PO CAPS: 300.0000 mg | ORAL_CAPSULE | Freq: Two times a day (BID) | ORAL | 0 refills | Status: AC

## 2022-05-13 NOTE — ED Provider Notes (Signed)
[facility] Provider Note    Event Date/Time   First MD Initiated Contact with Patient 05/13/22 1042     (approximate)   History   Back Pain   HPI  Gina Doyle is a 47 y.o. female with a past medical history of ulcers and symptomatic anemia presents today for evaluation of lower back pain that began yesterday after leaning over to put her socks on.  Patient reports that she felt a sharp stabbing pain in the center of her back.  She reports that she has pain that radiates occasionally down to her left knee.  She has not had any urinary or fecal incontinence or retention.  No saddle anesthesia.  She denies any fevers or chills.  No abdominal pain, chest pain, upper back pain, or trouble breathing.  She is still able to ambulate but has pain with doing so.  She does note that she has had foul-smelling urine for the past couple of weeks.  She denies any vaginal discharge.  Patient Active Problem List   Diagnosis Date Noted   Symptomatic anemia 10/21/2021   Duodenal ulcer    Multiple polyps of sigmoid colon    Iron deficiency anemia 09/17/2020          Physical Exam   Triage Vital Signs: ED Triage Vitals  Enc Vitals Group     BP 05/13/22 1026 (!) 163/108     Pulse Rate 05/13/22 1025 (!) 54     Resp 05/13/22 1025 18     Temp 05/13/22 1025 97.9 F (36.6 C)     Temp Source 05/13/22 1025 Oral     SpO2 05/13/22 1025 100 %     Weight --      Height --      Head Circumference --      Peak Flow --      Pain Score 05/13/22 1025 8     Pain Loc --      Pain Edu? --      Excl. in [place]? --     Most recent vital signs: Vitals:   05/13/22 1026 05/13/22 1217  BP: (!) 163/108 (!) 184/84  Pulse:  60  Resp:  16  Temp:  98.6 F (37 C)  SpO2:  100%    Physical Exam Vitals and nursing note reviewed.  Constitutional:      General: Awake and alert. No acute distress.    Appearance: Normal appearance. The patient is normal weight.  HENT:     Head:  Normocephalic and atraumatic.     Mouth: Mucous membranes are moist.  Eyes:     General: PERRL. Normal EOMs        Right eye: No discharge.        Left eye: No discharge.     Conjunctiva/sclera: Conjunctivae normal.  Cardiovascular:     Rate and Rhythm: Normal rate and regular rhythm.     Pulses: Normal pulses.  Pulmonary:     Effort: Pulmonary effort is normal. No respiratory distress.     Breath sounds: Normal breath sounds.  Abdominal:     Abdomen is soft. There is mild suprapubic abdominal tenderness. No rebound or guarding. No distention.  No CVA tenderness Musculoskeletal:        General: No swelling. Normal range of motion.     Cervical back: Normal range of motion and neck supple.  Back: Paraspinal and midline lumbar tenderness. Strength and sensation 5/5 to bilateral lower extremities. Normal great  toe extension against resistance. Normal sensation throughout feet. Normal patellar reflexes. Negative SLR and opposite SLR bilaterally.  Normal distal pulses bilaterally. Skin:    General: Skin is warm and dry.     Capillary Refill: Capillary refill takes less than 2 seconds.     Findings: No rash.  Neurological:     Mental Status: The patient is awake and alert.      ED Results / Procedures / Treatments   Labs (all labs ordered are listed, but only abnormal results are displayed) Labs Reviewed  URINALYSIS, ROUTINE W REFLEX MICROSCOPIC - Abnormal; Notable for the following components:      Result Value   Color, Urine YELLOW (*)    APPearance CLOUDY (*)    Protein, ur 100 (*)    Leukocytes,Ua LARGE (*)    Bacteria, UA RARE (*)    All other components within normal limits  POC URINE PREG, ED     EKG     RADIOLOGY I independently reviewed and interpreted imaging and agree with radiologists findings.     PROCEDURES:  Critical Care performed:   Procedures   MEDICATIONS ORDERED IN ED: Medications  lidocaine (LIDODERM) 5 % 1 patch (1 patch Transdermal  Patch Applied 05/13/22 1133)  dexamethasone (DECADRON) injection 10 mg (10 mg Intramuscular Given 05/13/22 1132)  morphine (PF) 4 MG/ML injection 5 mg (5 mg Intramuscular Given 05/13/22 1133)     IMPRESSION / MDM / ASSESSMENT AND PLAN / ED COURSE  I reviewed the triage vital signs and the nursing notes.   Differential diagnosis includes, but is not limited to, spasm, strain, herniated disc, urinary tract infection, pyelonephritis.  Patient is awake and alert, hemodynamically stable and afebrile.  She has 5 out of 5 strength with intact sensation to extensor hallucis dorsiflexion and plantarflexion of bilateral lower extremities with normal patellar reflexes bilaterally. Most likely etiology at this point is muscle strain vs herniated disc. No red flags to indicate patient is at risk for more auspicious process that would require urgent/emergent subspecialty evaluation at this time.  Given that she had diffuse lumbar tenderness including midline, CT scan was obtained which was normal.  No lower extremity weakness, urinary or fecal incontinence or retention, or saddle anesthesia to suggest cauda equina syndrome or spinal cord compression. No focal neurological deficits on exam. No constitutional symptoms or history of immunosuppression or IVDA to suggest potential for epidural abscess. Not anticoagulated, no history of bleeding diastasis to suggest risk for epidural hematoma. No chronic steroid use or advanced age or history of malignancy to suggest proclivity towards pathological fracture.  No abdominal pain or flank pain to suggest kidney stone, no history of kidney stone.   No chest pain, back pain, shortness of breath, neurological deficits, to suggest vascular catastrophe, and pulses are equal in all 4 extremities.  Discussed care instructions and return precautions with patient. Recommended close outpatient follow-up for re-evaluation. Patient agrees with plan of care. Will treat the patient  symptomatically as needed for pain control.  She was given Decadron and morphine given that she took Tylenol at home and is unable to take NSAIDs given her history of CKD.  Will discharge patient to take these medications and return for any worsening or different pain or development of any neurologic symptoms. Educated patient regarding expected time course for back pain to improve and recommended very close outpatient follow-up.  Urinalysis does reveal evidence of infection.  She does admit to foul-smelling urine for the past couple of weeks.  She denies any vaginal discharge.  She was started on antibiotics for this.  No CVA tenderness or fever to suggest pyelonephritis.  No history of kidney stones, no flank pain to suggest nephrolithiasis.  Upon reevaluation, patient reports that she feels significantly improved.  She is able to ambulate unassisted with a steady gait.  She did request a work note which was provided.  We also discussed no bending from the waist, no heavy lifting.  We discussed return cautions the importance of close outpatient follow-up.  She is not driving today.  Understands that she is unable to drive, operate heavy machinery, perform any test that require concentration today given that she was given morphine in the emergency department.  She understands and agrees.  She was discharged in stable condition.   Patient's presentation is most consistent with acute complicated illness / injury requiring diagnostic workup.    FINAL CLINICAL IMPRESSION(S) / ED DIAGNOSES   Final diagnoses:  Acute bilateral low back pain without sciatica  Cystitis     Rx / DC Orders   ED Discharge Orders          Ordered    predniSONE (STERAPRED UNI-PAK 21 TAB) 10 MG (21) TBPK tablet        05/13/22 1212    lidocaine (LIDODERM) 5 %  Every 12 hours        05/13/22 1212    cefdinir (OMNICEF) 300 MG capsule  2 times daily        05/13/22 1212             Note:  This document was  prepared using Dragon voice recognition software and may include unintentional dictation errors.   Emeline Gins 05/13/22 1357    Vanessa Navesink, MD 05/13/22 [phone]

## 2022-05-13 NOTE — ED Notes (Signed)
See triage note  Presents with lower back pain  states pain started yesterday after bending  Describes pain as muscle type  Ambulates slowly d/t pain

## 2022-05-13 NOTE — ED Triage Notes (Signed)
Patient to ED via POV for lower back pain. Patient states "it feels like my back is stuck." Started yesterday after bending over to put on socks.

## 2022-05-13 NOTE — Discharge Instructions (Signed)
Please take the antibiotics and the pain medications for your back.  Avoid any bending from the waist or heavy lifting. Please return to the emergency department for any new, worsening, or changing symptoms or other concerns including weakness in your legs, urinary or stool incontinence or retention, numbness or tingling in your extremities/buttocks/groin, fevers, or any other concerns or change in symptoms. It was a pleasure caring for you today!

## 2022-07-14 DIAGNOSIS — Z1389 Encounter for screening for other disorder: Secondary | ICD-10-CM | POA: Diagnosis not present

## 2022-07-14 DIAGNOSIS — A599 Trichomoniasis, unspecified: Secondary | ICD-10-CM | POA: Diagnosis not present

## 2022-07-14 DIAGNOSIS — R8290 Unspecified abnormal findings in urine: Secondary | ICD-10-CM | POA: Diagnosis not present

## 2022-07-14 DIAGNOSIS — Z0131 Encounter for examination of blood pressure with abnormal findings: Secondary | ICD-10-CM | POA: Diagnosis not present

## 2022-07-14 DIAGNOSIS — D509 Iron deficiency anemia, unspecified: Secondary | ICD-10-CM | POA: Diagnosis not present

## 2022-07-14 DIAGNOSIS — N1832 Chronic kidney disease, stage 3b: Secondary | ICD-10-CM | POA: Diagnosis not present

## 2022-07-14 DIAGNOSIS — Z113 Encounter for screening for infections with a predominantly sexual mode of transmission: Secondary | ICD-10-CM | POA: Diagnosis not present

## 2022-07-14 DIAGNOSIS — Z1331 Encounter for screening for depression: Secondary | ICD-10-CM | POA: Diagnosis not present

## 2022-07-14 DIAGNOSIS — I10 Essential (primary) hypertension: Secondary | ICD-10-CM | POA: Diagnosis not present

## 2022-08-25 DIAGNOSIS — Z1159 Encounter for screening for other viral diseases: Secondary | ICD-10-CM | POA: Diagnosis not present

## 2022-08-25 DIAGNOSIS — Z0000 Encounter for general adult medical examination without abnormal findings: Secondary | ICD-10-CM | POA: Diagnosis not present

## 2022-08-25 DIAGNOSIS — N1832 Chronic kidney disease, stage 3b: Secondary | ICD-10-CM | POA: Diagnosis not present

## 2022-08-25 DIAGNOSIS — Z1389 Encounter for screening for other disorder: Secondary | ICD-10-CM | POA: Diagnosis not present

## 2022-08-25 DIAGNOSIS — I10 Essential (primary) hypertension: Secondary | ICD-10-CM | POA: Diagnosis not present

## 2022-08-25 DIAGNOSIS — R8290 Unspecified abnormal findings in urine: Secondary | ICD-10-CM | POA: Diagnosis not present

## 2022-08-25 DIAGNOSIS — Z113 Encounter for screening for infections with a predominantly sexual mode of transmission: Secondary | ICD-10-CM | POA: Diagnosis not present

## 2022-08-25 DIAGNOSIS — Z124 Encounter for screening for malignant neoplasm of cervix: Secondary | ICD-10-CM | POA: Diagnosis not present

## 2022-08-25 DIAGNOSIS — Z0131 Encounter for examination of blood pressure with abnormal findings: Secondary | ICD-10-CM | POA: Diagnosis not present

## 2022-08-25 DIAGNOSIS — Z23 Encounter for immunization: Secondary | ICD-10-CM | POA: Diagnosis not present

## 2022-08-26 ENCOUNTER — Other Ambulatory Visit: Payer: Self-pay | Admitting: Family Medicine

## 2022-08-26 DIAGNOSIS — Z1231 Encounter for screening mammogram for malignant neoplasm of breast: Secondary | ICD-10-CM

## 2022-10-19 MED FILL — Iron Sucrose Inj 20 MG/ML (Fe Equiv): INTRAVENOUS | Qty: 10 | Status: AC

## 2022-10-20 ENCOUNTER — Inpatient Hospital Stay: Payer: Medicaid Other | Admitting: Internal Medicine

## 2022-10-20 ENCOUNTER — Inpatient Hospital Stay: Payer: Medicaid Other

## 2022-10-20 DIAGNOSIS — E559 Vitamin D deficiency, unspecified: Secondary | ICD-10-CM | POA: Diagnosis not present

## 2022-10-20 DIAGNOSIS — I150 Renovascular hypertension: Secondary | ICD-10-CM | POA: Diagnosis not present

## 2022-10-20 DIAGNOSIS — N1832 Chronic kidney disease, stage 3b: Secondary | ICD-10-CM | POA: Diagnosis not present

## 2022-10-20 DIAGNOSIS — R809 Proteinuria, unspecified: Secondary | ICD-10-CM | POA: Diagnosis not present

## 2022-10-20 NOTE — Assessment & Plan Note (Deleted)
#  Iron deficient anemia [ferritin 3.5/hemoglobin 8.2-June 2022; [facility] neph[facility]]. S/p IV iron infusion- improved. June 2023- iron sat-12%; ferritin-6  # Hb 12.5-; HOLD iron infusion.  Continue starting p.o. iron pills/gentle iron [Hx of constipation].   # CKD stage IV- GFR-stable.   # Left sided neck pain- ? Cervical nerve impingement. Defer to PCP.   #Elevated blood pressure-150-140s at home. again recommend compliance with her blood pressure medications; close follow-up with nephrology. And again, recommend follow up with nephology.   #Active smoker:counselled to quit smoking/cutting back [3 cig/day]  # DISPOSITION: # HOLD venofer today # follow up in 6 months MD; labs- cbc/bmp; Iron studies/ferritin; possible venfoer--Dr.B

## 2022-10-20 NOTE — Progress Notes (Deleted)
[name] CONSULT NOTE  Patient Care Team: Emogene Morgan, MD as PCP - General (Family Medicine) Earna Coder, MD as Consulting Physician (Internal Medicine)  CHIEF COMPLAINTS/PURPOSE OF CONSULTATION: ANEMIA   HEMATOLOGY HISTORY:  # ANEMIA [June 2022- Ferritin-3.5; Hb- 8.3; MCv-73] EGD/Colonoscopy:none [awaiting GI EGD/ re: stomach ulcers]; Vaginal bleeding: previous heavy [3-4 months]- June 2022- CT A/P- Motion degraded.  No acute abnormality identified; Few small foci of lucency within the iliac bones. Indeterminate but without aggressive features. Could be related to CKD.   # CKD stage IV [[place] ~2010]/Proteinuria [No Kidney Bx]; Poorly controlled HTN; Hx of peptic ulcer [awaiting GI]   HISTORY OF PRESENTING ILLNESS: Alone.  Walking independently.  Gina Doyle 47 y.o.  female with above history of chronic kidney disease -stage IV/ severe anemia s/p IV venofer is here for a follow up.  Pt in for follow up, Reports energy are "ok". Pt states she has had a stiff neck with radiating headaches on left side of head for a few months   Patient denies any blood in stools or black-colored stools.  No nausea no vomiting.  She is on gentle p.o. iron- but admits to non-compliance.   Review of Systems  Constitutional:  Positive for malaise/fatigue. Negative for chills, diaphoresis, fever and weight loss.  HENT:  Negative for nosebleeds and sore throat.   Eyes:  Negative for double vision.  Respiratory:  Negative for cough, hemoptysis, sputum production, shortness of breath and wheezing.   Cardiovascular:  Negative for chest pain, palpitations, orthopnea and leg swelling.  Gastrointestinal:  Positive for abdominal pain. Negative for blood in stool, constipation, diarrhea, heartburn, melena, nausea and vomiting.  Genitourinary:  Negative for dysuria, frequency and urgency.  Musculoskeletal:  Positive for back pain. Negative for joint pain.  Skin: Negative.   Negative for itching and rash.  Neurological:  Negative for dizziness, tingling, focal weakness, weakness and headaches.  Endo/Heme/Allergies:  Does not bruise/bleed easily.  Psychiatric/Behavioral:  Negative for depression. The patient is not nervous/anxious and does not have insomnia.     MEDICAL HISTORY:  Past Medical History:  Diagnosis Date  . CKD (chronic kidney disease)   . GERD (gastroesophageal reflux disease)   . History of COVID-19 03/2020  . Hypertension   . IDA (iron deficiency anemia)   . Peptic ulcer disease     SURGICAL HISTORY: Past Surgical History:  Procedure Laterality Date  . COLONOSCOPY WITH PROPOFOL N/A 05/26/2021   Procedure: COLONOSCOPY WITH PROPOFOL;  Surgeon: Toney Reil, MD;  Location: [facility];  Service: Endoscopy;  Laterality: N/A;  . ESOPHAGOGASTRODUODENOSCOPY (EGD) WITH PROPOFOL N/A 05/26/2021   Procedure: ESOPHAGOGASTRODUODENOSCOPY (EGD) WITH PROPOFOL;  Surgeon: Toney Reil, MD;  Location: [facility];  Service: Endoscopy;  Laterality: N/A;    SOCIAL HISTORY: Social History   Socioeconomic History  . Marital status: Single    Spouse name: Not on file  . Number of children: Not on file  . Years of education: Not on file  . Highest education level: Not on file  Occupational History  . Not on file  Tobacco Use  . Smoking status: Every Day    Current packs/day: 0.25    Average packs/day: 0.3 packs/day for 14.0 years (3.5 ttl pk-yrs)    Types: Cigarettes  . Smokeless tobacco: Never  . Tobacco comments:    Started smoking age 5.  No cigs since 05/13/21  Vaping Use  . Vaping status: Never Used  Substance and Sexual  Activity  . Alcohol use: Not Currently  . Drug use: Not Currently  . Sexual activity: Not on file  Other Topics Concern  . Not on file  Social History Narrative   Lives in [place] with son. Smoke 5- cig/day; No alcohol; works at [employer].    Social Determinants of Health   Financial Resource  Strain: Not on file  Food Insecurity: Not on file  Transportation Needs: Not on file  Physical Activity: Not on file  Stress: Not on file  Social Connections: Not on file  Intimate Partner Violence: Not on file    FAMILY HISTORY: Family History  Problem Relation Age of Onset  . Cancer Maternal Grandfather   . Cancer Paternal Grandmother   . Pancreatic cancer Paternal Grandmother     ALLERGIES:  has No Known Allergies.  MEDICATIONS:  Current Outpatient Medications  Medication Sig Dispense Refill  . cyclobenzaprine (FLEXERIL) 5 MG tablet Take 1 tablet (5 mg total) by mouth 3 (three) times daily as needed for muscle spasms (back pain). 20 tablet 0  . losartan-hydrochlorothiazide (HYZAAR) 100-25 MG tablet Take 1 tablet by mouth daily.    Marland Kitchen omeprazole (PRILOSEC) 40 MG capsule Take 1 capsule (40 mg total) by mouth 2 (two) times daily before a meal. 60 capsule 2  . predniSONE (STERAPRED UNI-PAK 21 TAB) 10 MG (21) TBPK tablet Take 6 tablets by mouth on day 1 and decrease by 1 tablet for each subsequent day 21 tablet 0   No current facility-administered medications for this visit.      PHYSICAL EXAMINATION:   There were no vitals filed for this visit.  There were no vitals filed for this visit.   Physical Exam Vitals and nursing note reviewed.  Constitutional:      Comments: Ambulating: independently;   Alone  HENT:     Head: Normocephalic and atraumatic.     Mouth/Throat:     Pharynx: Oropharynx is clear.  Eyes:     Extraocular Movements: Extraocular movements intact.     Pupils: Pupils are equal, round, and reactive to light.  Cardiovascular:     Rate and Rhythm: Normal rate and regular rhythm.  Pulmonary:     Comments: Decreased breath sounds bilaterally.  Abdominal:     Palpations: Abdomen is soft.  Musculoskeletal:        General: Normal range of motion.     Cervical back: Normal range of motion.  Skin:    General: Skin is warm.  Neurological:     General:  No focal deficit present.     Mental Status: She is alert and oriented to person, place, and time.  Psychiatric:        Behavior: Behavior normal.        Judgment: Judgment normal.    LABORATORY DATA:  I have reviewed the data as listed Lab Results  Component Value Date   WBC 4.9 04/23/2022   HGB 12.5 04/23/2022   HCT 39.5 04/23/2022   MCV 87.2 04/23/2022   PLT 348 04/23/2022   Recent Labs    10/21/21 1357 04/23/22 1245  NA 137 135  K 4.8 4.4  CL 113* 107  CO2 20* 22  GLUCOSE 88 92  BUN 19 31*  CREATININE 1.97* 2.19*  CALCIUM 9.9 10.2  GFRNONAA 31* 27*     No results found.  Symptomatic anemia #Iron deficient anemia [ferritin 3.5/hemoglobin 8.2-June 2022; [facility]]. S/p IV iron infusion- improved. June 2023- iron sat-12%; ferritin-6  # Hb 12.5-;  HOLD iron infusion.  Continue starting p.o. iron pills/gentle iron [Hx of constipation].   # CKD stage IV- GFR-stable.   # Left sided neck pain- ? Cervical nerve impingement. Defer to PCP.   #Elevated blood pressure-150-140s at home. again recommend compliance with her blood pressure medications; close follow-up with nephrology. And again, recommend follow up with nephology.   #Active smoker:counselled to quit smoking/cutting back [3 cig/day]  # DISPOSITION: # HOLD venofer today # follow up in 6 months MD; labs- cbc/bmp; Iron studies/ferritin; possible venfoer--Dr.B    All questions were answered. The patient knows to call the clinic with any problems, questions or concerns.    Earna Coder, MD 10/20/2022 11:21 AM

## 2022-10-27 ENCOUNTER — Inpatient Hospital Stay: Payer: Medicaid Other

## 2022-10-27 ENCOUNTER — Inpatient Hospital Stay: Payer: Medicaid Other | Attending: Internal Medicine

## 2022-10-27 ENCOUNTER — Inpatient Hospital Stay (HOSPITAL_BASED_OUTPATIENT_CLINIC_OR_DEPARTMENT_OTHER): Payer: Medicaid Other | Admitting: Internal Medicine

## 2022-10-27 ENCOUNTER — Encounter: Payer: Self-pay | Admitting: Internal Medicine

## 2022-10-27 VITALS — BP 150/105

## 2022-10-27 VITALS — BP 156/105 | HR 72 | Temp 97.2°F | Ht 69.0 in | Wt 201.0 lb

## 2022-10-27 DIAGNOSIS — D509 Iron deficiency anemia, unspecified: Secondary | ICD-10-CM

## 2022-10-27 DIAGNOSIS — E611 Iron deficiency: Secondary | ICD-10-CM

## 2022-10-27 DIAGNOSIS — D631 Anemia in chronic kidney disease: Secondary | ICD-10-CM | POA: Insufficient documentation

## 2022-10-27 DIAGNOSIS — N184 Chronic kidney disease, stage 4 (severe): Secondary | ICD-10-CM | POA: Insufficient documentation

## 2022-10-27 DIAGNOSIS — D649 Anemia, unspecified: Secondary | ICD-10-CM

## 2022-10-27 DIAGNOSIS — D508 Other iron deficiency anemias: Secondary | ICD-10-CM

## 2022-10-27 LAB — IRON AND TIBC
Iron: 32 ug/dL (ref 28–170)
Saturation Ratios: 8 % — ABNORMAL LOW (ref 10.4–31.8)
TIBC: 403 ug/dL (ref 250–450)
UIBC: 371 ug/dL

## 2022-10-27 LAB — CBC WITH DIFFERENTIAL/PLATELET
Abs Immature Granulocytes: 0 10*3/uL (ref 0.00–0.07)
Basophils Absolute: 0 10*3/uL (ref 0.0–0.1)
Basophils Relative: 1 %
Eosinophils Absolute: 0.2 10*3/uL (ref 0.0–0.5)
Eosinophils Relative: 4 %
HCT: 39.3 % (ref 36.0–46.0)
Hemoglobin: 12.1 g/dL (ref 12.0–15.0)
Immature Granulocytes: 0 %
Lymphocytes Relative: 25 %
Lymphs Abs: 1.1 10*3/uL (ref 0.7–4.0)
MCH: 27.5 pg (ref 26.0–34.0)
MCHC: 30.8 g/dL (ref 30.0–36.0)
MCV: 89.3 fL (ref 80.0–100.0)
Monocytes Absolute: 0.4 10*3/uL (ref 0.1–1.0)
Monocytes Relative: 9 %
Neutro Abs: 2.6 10*3/uL (ref 1.7–7.7)
Neutrophils Relative %: 61 %
Platelets: 280 10*3/uL (ref 150–400)
RBC: 4.4 MIL/uL (ref 3.87–5.11)
RDW: 17.3 % — ABNORMAL HIGH (ref 11.5–15.5)
WBC: 4.2 10*3/uL (ref 4.0–10.5)
nRBC: 0 % (ref 0.0–0.2)

## 2022-10-27 LAB — BASIC METABOLIC PANEL
Anion gap: 7 (ref 5–15)
BUN: 22 mg/dL — ABNORMAL HIGH (ref 6–20)
CO2: 19 mmol/L — ABNORMAL LOW (ref 22–32)
Calcium: 10.2 mg/dL (ref 8.9–10.3)
Chloride: 110 mmol/L (ref 98–111)
Creatinine, Ser: 2.26 mg/dL — ABNORMAL HIGH (ref 0.44–1.00)
GFR, Estimated: 26 mL/min — ABNORMAL LOW (ref >60)
Glucose, Bld: 110 mg/dL — ABNORMAL HIGH (ref 70–99)
Potassium: 4.4 mmol/L (ref 3.5–5.1)
Sodium: 136 mmol/L (ref 135–145)

## 2022-10-27 LAB — FERRITIN: Ferritin: 6 ng/mL — ABNORMAL LOW (ref 11–307)

## 2022-10-27 MED ORDER — SODIUM CHLORIDE 0.9 % IV SOLN
200.0000 mg | Freq: Once | INTRAVENOUS | Status: AC
  Administered 2022-10-27: 200 mg via INTRAVENOUS
  Filled 2022-10-27: qty 200

## 2022-10-27 MED ORDER — SODIUM CHLORIDE 0.9 % IV SOLN
Freq: Once | INTRAVENOUS | Status: AC
  Filled 2022-10-27: qty 250

## 2022-10-27 NOTE — Progress Notes (Signed)
Fatigue/weakness: no Dyspena: no Light headedness: no Blood in stool: no  Occasionally gets some blurred vision when overly tired.

## 2022-10-27 NOTE — Progress Notes (Signed)
[facility] CONSULT NOTE  Patient Care Team: Emogene Morgan, MD as PCP - General (Family Medicine) Earna Coder, MD as Consulting Physician (Internal Medicine)  CHIEF COMPLAINTS/PURPOSE OF CONSULTATION: ANEMIA   HEMATOLOGY HISTORY:  # ANEMIA [June 2022- Ferritin-3.5; Hb- 8.3; MCv-73] EGD/Colonoscopy:none [awaiting GI EGD/ re: stomach ulcers]; Vaginal bleeding: previous heavy [3-4 months]- June 2022- CT A/P- Motion degraded.  No acute abnormality identified; Few small foci of lucency within the iliac bones. Indeterminate but without aggressive features. Could be related to CKD.   # CKD stage IV [[place] ~2010]/Proteinuria [No Kidney Bx]; Poorly controlled HTN; Hx of peptic ulcer [awaiting GI]   HISTORY OF PRESENTING ILLNESS: Alone.  Walking independently.  Gina Doyle 47 y.o.  female with above history of chronic kidney disease -stage IV/ severe anemia s/p IV venofer is here for a follow up.  Patient denies any blood in stools or black-colored stools.  No nausea no vomiting.  She is on gentle p.o. iron- but admits to non-compliance.   Review of Systems  Constitutional:  Positive for malaise/fatigue. Negative for chills, diaphoresis, fever and weight loss.  HENT:  Negative for nosebleeds and sore throat.   Eyes:  Negative for double vision.  Respiratory:  Negative for cough, hemoptysis, sputum production, shortness of breath and wheezing.   Cardiovascular:  Negative for chest pain, palpitations, orthopnea and leg swelling.  Gastrointestinal:  Positive for abdominal pain. Negative for blood in stool, constipation, diarrhea, heartburn, melena, nausea and vomiting.  Genitourinary:  Negative for dysuria, frequency and urgency.  Musculoskeletal:  Positive for back pain. Negative for joint pain.  Skin: Negative.  Negative for itching and rash.  Neurological:  Negative for dizziness, tingling, focal weakness, weakness and headaches.  Endo/Heme/Allergies:  Does not  bruise/bleed easily.  Psychiatric/Behavioral:  Negative for depression. The patient is not nervous/anxious and does not have insomnia.     MEDICAL HISTORY:  Past Medical History:  Diagnosis Date   CKD (chronic kidney disease)    GERD (gastroesophageal reflux disease)    History of COVID-19 03/2020   Hypertension    IDA (iron deficiency anemia)    Peptic ulcer disease     SURGICAL HISTORY: Past Surgical History:  Procedure Laterality Date   COLONOSCOPY WITH PROPOFOL N/A 05/26/2021   Procedure: COLONOSCOPY WITH PROPOFOL;  Surgeon: Toney Reil, MD;  Location: [facility];  Service: Endoscopy;  Laterality: N/A;   ESOPHAGOGASTRODUODENOSCOPY (EGD) WITH PROPOFOL N/A 05/26/2021   Procedure: ESOPHAGOGASTRODUODENOSCOPY (EGD) WITH PROPOFOL;  Surgeon: Toney Reil, MD;  Location: [facility];  Service: Endoscopy;  Laterality: N/A;    SOCIAL HISTORY: Social History   Socioeconomic History   Marital status: Single    Spouse name: Not on file   Number of children: Not on file   Years of education: Not on file   Highest education level: Not on file  Occupational History   Not on file  Tobacco Use   Smoking status: Every Day    Current packs/day: 0.25    Average packs/day: 0.3 packs/day for 14.0 years (3.5 ttl pk-yrs)    Types: Cigarettes   Smokeless tobacco: Never   Tobacco comments:    Started smoking age 87.  No cigs since 05/13/21  Vaping Use   Vaping status: Never Used  Substance and Sexual Activity   Alcohol use: Not Currently   Drug use: Not Currently   Sexual activity: Not on file  Other Topics Concern   Not on file  Social  History Narrative   Lives in [place] with son. Smoke 5- cig/day; No alcohol; works at [employer].    Social Determinants of Health   Financial Resource Strain: Not on file  Food Insecurity: Not on file  Transportation Needs: Not on file  Physical Activity: Not on file  Stress: Not on file  Social Connections: Not on file   Intimate Partner Violence: Not on file    FAMILY HISTORY: Family History  Problem Relation Age of Onset   Cancer Maternal Grandfather    Cancer Paternal Grandmother    Pancreatic cancer Paternal Grandmother     ALLERGIES:  has No Known Allergies.  MEDICATIONS:  Current Outpatient Medications  Medication Sig Dispense Refill   losartan-hydrochlorothiazide (HYZAAR) 100-25 MG tablet Take 1 tablet by mouth daily.     cyclobenzaprine (FLEXERIL) 5 MG tablet Take 1 tablet (5 mg total) by mouth 3 (three) times daily as needed for muscle spasms (back pain). (Patient not taking: Reported on 10/27/2022) 20 tablet 0   omeprazole (PRILOSEC) 40 MG capsule Take 1 capsule (40 mg total) by mouth 2 (two) times daily before a meal. 60 capsule 2   No current facility-administered medications for this visit.      PHYSICAL EXAMINATION:   Vitals:   10/27/22 1344  BP: (!) 156/105  Pulse: 72  Temp: (!) 97.2 F (36.2 C)  SpO2: 100%    Filed Weights   10/27/22 1344  Weight: 201 lb (91.2 kg)     Physical Exam Vitals and nursing note reviewed.  Constitutional:      Comments: Ambulating: independently;   Alone  HENT:     Head: Normocephalic and atraumatic.     Mouth/Throat:     Pharynx: Oropharynx is clear.  Eyes:     Extraocular Movements: Extraocular movements intact.     Pupils: Pupils are equal, round, and reactive to light.  Cardiovascular:     Rate and Rhythm: Normal rate and regular rhythm.  Pulmonary:     Comments: Decreased breath sounds bilaterally.  Abdominal:     Palpations: Abdomen is soft.  Musculoskeletal:        General: Normal range of motion.     Cervical back: Normal range of motion.  Skin:    General: Skin is warm.  Neurological:     General: No focal deficit present.     Mental Status: She is alert and oriented to person, place, and time.  Psychiatric:        Behavior: Behavior normal.        Judgment: Judgment normal.     LABORATORY DATA:  I have  reviewed the data as listed Lab Results  Component Value Date   WBC 4.2 10/27/2022   HGB 12.1 10/27/2022   HCT 39.3 10/27/2022   MCV 89.3 10/27/2022   PLT 280 10/27/2022   Recent Labs    04/23/22 1245 10/27/22 1350  NA 135 136  K 4.4 4.4  CL 107 110  CO2 22 19*  GLUCOSE 92 110*  BUN 31* 22*  CREATININE 2.19* 2.26*  CALCIUM 10.2 10.2  GFRNONAA 27* 26*     No results found.  Symptomatic anemia #Iron deficient anemia [ferritin 3.5/hemoglobin 8.2-June 2022; [facility]]. S/p IV iron infusion- improved. FEB 2024- iron sat-12%; ferritin-6  # Hb 12.3-; proceed with iron infusion.  Continue starting p.o. iron pills/gentle iron [Hx of constipation].   # CKD stage IV- GFR-stable.   #Elevated blood pressure-150-140s at home. again recommend compliance with her blood  pressure medications; close follow-up with nephrology. And again, recommend follow up with nephology.   #Active smoker:counselled to quit smoking/cutting back [3 cig/day]  # DISPOSITION: # Venofer today # follow up in 6 months MD; labs- cbc/bmp; Iron studies/ferritin; possible venfoer--Dr.B  All questions were answered. The patient knows to call the clinic with any problems, questions or concerns.    Earna Coder, MD 10/27/2022 2:52 PM

## 2022-10-27 NOTE — Assessment & Plan Note (Signed)
#  Iron deficient anemia [ferritin 3.5/hemoglobin 8.2-June 2022; [facility]]. S/p IV iron infusion- improved. FEB 2024- iron sat-12%; ferritin-6  # Hb 12.3-; proceed with iron infusion.  Continue starting p.o. iron pills/gentle iron [Hx of constipation].   # CKD stage IV- GFR-stable.   #Elevated blood pressure-150-140s at home. again recommend compliance with her blood pressure medications; close follow-up with nephrology. And again, recommend follow up with nephology.   #Active smoker:counselled to quit smoking/cutting back [3 cig/day]  # DISPOSITION: # Venofer today # follow up in 6 months MD; labs- cbc/bmp; Iron studies/ferritin; possible venfoer--Dr.B

## 2022-10-27 NOTE — Progress Notes (Signed)
Pt has been educated and understands. Pt declined to stay 30 mins after iron infusion. Discussed talking with primary or nephrology about her BP. Patient stated that she will and has an appointment coming up.

## 2022-12-30 DIAGNOSIS — Z20822 Contact with and (suspected) exposure to covid-19: Secondary | ICD-10-CM | POA: Diagnosis not present

## 2022-12-30 DIAGNOSIS — J069 Acute upper respiratory infection, unspecified: Secondary | ICD-10-CM | POA: Diagnosis not present

## 2023-02-23 DIAGNOSIS — I150 Renovascular hypertension: Secondary | ICD-10-CM | POA: Diagnosis not present

## 2023-02-23 DIAGNOSIS — R809 Proteinuria, unspecified: Secondary | ICD-10-CM | POA: Diagnosis not present

## 2023-02-23 DIAGNOSIS — E559 Vitamin D deficiency, unspecified: Secondary | ICD-10-CM | POA: Diagnosis not present

## 2023-02-23 DIAGNOSIS — N1832 Chronic kidney disease, stage 3b: Secondary | ICD-10-CM | POA: Diagnosis not present

## 2023-03-09 ENCOUNTER — Other Ambulatory Visit: Payer: Self-pay

## 2023-03-09 ENCOUNTER — Emergency Department
Admission: EM | Admit: 2023-03-09 | Discharge: 2023-03-09 | Disposition: A | Discharge status: Home / Self Care | Payer: Medicaid Other | Attending: Emergency Medicine | Admitting: Emergency Medicine

## 2023-03-09 DIAGNOSIS — N760 Acute vaginitis: Secondary | ICD-10-CM | POA: Insufficient documentation

## 2023-03-09 DIAGNOSIS — N189 Chronic kidney disease, unspecified: Secondary | ICD-10-CM | POA: Insufficient documentation

## 2023-03-09 DIAGNOSIS — N898 Other specified noninflammatory disorders of vagina: Secondary | ICD-10-CM | POA: Diagnosis present

## 2023-03-09 DIAGNOSIS — I129 Hypertensive chronic kidney disease with stage 1 through stage 4 chronic kidney disease, or unspecified chronic kidney disease: Secondary | ICD-10-CM | POA: Diagnosis not present

## 2023-03-09 DIAGNOSIS — B9689 Other specified bacterial agents as the cause of diseases classified elsewhere: Secondary | ICD-10-CM | POA: Insufficient documentation

## 2023-03-09 LAB — URINALYSIS, ROUTINE W REFLEX MICROSCOPIC
Bilirubin Urine: NEGATIVE
Glucose, UA: NEGATIVE mg/dL
Hgb urine dipstick: NEGATIVE
Ketones, ur: NEGATIVE mg/dL
Leukocytes,Ua: NEGATIVE
Nitrite: NEGATIVE
Protein, ur: 30 mg/dL — AB
Specific Gravity, Urine: 1.008 (ref 1.005–1.030)
pH: 5 (ref 5.0–8.0)

## 2023-03-09 LAB — WET PREP, GENITAL
Sperm: NONE SEEN
Trich, Wet Prep: NONE SEEN
WBC, Wet Prep HPF POC: >=10 — AB (ref <10)
Yeast Wet Prep HPF POC: NONE SEEN

## 2023-03-09 LAB — CHLAMYDIA/NGC RT PCR (ARMC ONLY)
Chlamydia Tr: NOT DETECTED
N gonorrhoeae: NOT DETECTED

## 2023-03-09 LAB — PREGNANCY, URINE: Preg Test, Ur: NEGATIVE

## 2023-03-09 MED ORDER — METRONIDAZOLE 500 MG PO TABS: 500.0000 mg | ORAL_TABLET | Freq: Two times a day (BID) | ORAL | 0 refills | Status: AC

## 2023-03-09 NOTE — ED Triage Notes (Signed)
Pt to ED for pelvic discomfort with white discharge x3 days. Reports has had this before with BV Also reports painful urination

## 2023-03-09 NOTE — ED Provider Notes (Signed)
[facility] Provider Note    Event Date/Time   First MD Initiated Contact with Patient 03/09/23 [phone]     (approximate)   History   Vaginal Discharge   HPI  Gina Doyle is a 47 y.o. female with history of hypertension, PUD, GERD, CKD presents emergency department with vaginal discharge.  Patient states has a fishy odor.  No pain.  No fever or chills.  Symptoms for several days      Physical Exam   Triage Vital Signs: ED Triage Vitals  Encounter Vitals Group     BP 03/09/23 0436 (!) 177/88     Systolic BP Percentile --      Diastolic BP Percentile --      Pulse Rate 03/09/23 0436 61     Resp 03/09/23 0436 18     Temp 03/09/23 0436 98.1 F (36.7 C)     Temp src --      SpO2 03/09/23 0436 100 %     Weight 03/09/23 0437 196 lb (88.9 kg)     Height 03/09/23 0437 5\' 9"  (1.753 m)     Head Circumference --      Peak Flow --      Pain Score 03/09/23 0437 0     Pain Loc --      Pain Education --      Exclude from Growth Chart --     Most recent vital signs: Vitals:   03/09/23 0436  BP: (!) 177/88  Pulse: 61  Resp: 18  Temp: 98.1 F (36.7 C)  SpO2: 100%     General: Awake, no distress.   CV:  Good peripheral perfusion. regular rate and  rhythm Resp:  Normal effort.  Abd:  No distention.   Other:  Pelvic exam deferred by patient   ED Results / Procedures / Treatments   Labs (all labs ordered are listed, but only abnormal results are displayed) Labs Reviewed  WET PREP, GENITAL - Abnormal; Notable for the following components:      Result Value   Clue Cells Wet Prep HPF POC PRESENT (*)    WBC, Wet Prep HPF POC >=10 (*)    All other components within normal limits  URINALYSIS, ROUTINE W REFLEX MICROSCOPIC - Abnormal; Notable for the following components:   Color, Urine STRAW (*)    APPearance CLEAR (*)    Protein, ur 30 (*)    Bacteria, UA RARE (*)    All other components within normal limits  CHLAMYDIA/NGC RT PCR ([facility] ONLY)             PREGNANCY, URINE     EKG     RADIOLOGY     PROCEDURES:   Procedures   MEDICATIONS ORDERED IN ED: Medications - No data to display   IMPRESSION / MDM / ASSESSMENT AND PLAN / ED COURSE  I reviewed the triage vital signs and the nursing notes.                              Differential diagnosis includes, but is not limited to, bacterial vaginosis, chlamydia, gonorrhea, trichomoniasis, yeast  Patient's presentation is most consistent with acute illness / injury with system symptoms.   Wet prep positive for clue cells and WBCs, remainder of her labs are reassuring  I did explain these findings to the patient.  She was given a prescription for Flagyl.  She is to follow-up with  her regular doctor.  Return emergency department if worsening.  Or see [facility] department.  Discharged stable condition.      FINAL CLINICAL IMPRESSION(S) / ED DIAGNOSES   Final diagnoses:  BV (bacterial vaginosis)     Rx / DC Orders   ED Discharge Orders          Ordered    metroNIDAZOLE (FLAGYL) 500 MG tablet  2 times daily        03/09/23 0454             Note:  This document was prepared using Dragon voice recognition software and may include unintentional dictation errors.    Faythe Ghee, PA-C 03/09/23 0981    Sharyn Creamer, MD 03/15/23 [phone]

## 2023-04-27 ENCOUNTER — Inpatient Hospital Stay: Payer: Medicaid Other

## 2023-04-27 ENCOUNTER — Inpatient Hospital Stay (HOSPITAL_BASED_OUTPATIENT_CLINIC_OR_DEPARTMENT_OTHER): Payer: Medicaid Other | Admitting: Internal Medicine

## 2023-04-27 ENCOUNTER — Inpatient Hospital Stay: Payer: Medicaid Other | Attending: Internal Medicine

## 2023-04-27 ENCOUNTER — Encounter: Payer: Self-pay | Admitting: Internal Medicine

## 2023-04-27 VITALS — BP 148/90 | HR 59 | Temp 98.0°F | Resp 18 | Wt 203.0 lb

## 2023-04-27 DIAGNOSIS — D649 Anemia, unspecified: Secondary | ICD-10-CM | POA: Diagnosis not present

## 2023-04-27 DIAGNOSIS — F17210 Nicotine dependence, cigarettes, uncomplicated: Secondary | ICD-10-CM | POA: Insufficient documentation

## 2023-04-27 DIAGNOSIS — I129 Hypertensive chronic kidney disease with stage 1 through stage 4 chronic kidney disease, or unspecified chronic kidney disease: Secondary | ICD-10-CM | POA: Diagnosis not present

## 2023-04-27 DIAGNOSIS — N184 Chronic kidney disease, stage 4 (severe): Secondary | ICD-10-CM | POA: Insufficient documentation

## 2023-04-27 DIAGNOSIS — Z79899 Other long term (current) drug therapy: Secondary | ICD-10-CM | POA: Diagnosis not present

## 2023-04-27 LAB — BASIC METABOLIC PANEL
Anion gap: 6 (ref 5–15)
BUN: 17 mg/dL (ref 6–20)
CO2: 19 mmol/L — ABNORMAL LOW (ref 22–32)
Calcium: 9.9 mg/dL (ref 8.9–10.3)
Chloride: 111 mmol/L (ref 98–111)
Creatinine, Ser: 2 mg/dL — ABNORMAL HIGH (ref 0.44–1.00)
GFR, Estimated: 30 mL/min — ABNORMAL LOW (ref >60)
Glucose, Bld: 91 mg/dL (ref 70–99)
Potassium: 4.8 mmol/L (ref 3.5–5.1)
Sodium: 136 mmol/L (ref 135–145)

## 2023-04-27 LAB — CBC WITH DIFFERENTIAL (CANCER CENTER ONLY)
Abs Immature Granulocytes: 0.01 10*3/uL (ref 0.00–0.07)
Basophils Absolute: 0 10*3/uL (ref 0.0–0.1)
Basophils Relative: 1 %
Eosinophils Absolute: 0.3 10*3/uL (ref 0.0–0.5)
Eosinophils Relative: 7 %
HCT: 40.8 % (ref 36.0–46.0)
Hemoglobin: 13.1 g/dL (ref 12.0–15.0)
Immature Granulocytes: 0 %
Lymphocytes Relative: 31 %
Lymphs Abs: 1.3 10*3/uL (ref 0.7–4.0)
MCH: 30.3 pg (ref 26.0–34.0)
MCHC: 32.1 g/dL (ref 30.0–36.0)
MCV: 94.4 fL (ref 80.0–100.0)
Monocytes Absolute: 0.4 10*3/uL (ref 0.1–1.0)
Monocytes Relative: 9 %
Neutro Abs: 2.2 10*3/uL (ref 1.7–7.7)
Neutrophils Relative %: 52 %
Platelet Count: 284 10*3/uL (ref 150–400)
RBC: 4.32 MIL/uL (ref 3.87–5.11)
RDW: 15.9 % — ABNORMAL HIGH (ref 11.5–15.5)
WBC Count: 4.2 10*3/uL (ref 4.0–10.5)
nRBC: 0 % (ref 0.0–0.2)

## 2023-04-27 LAB — IRON AND TIBC
Iron: 97 ug/dL (ref 28–170)
Saturation Ratios: 28 % (ref 10.4–31.8)
TIBC: 349 ug/dL (ref 250–450)
UIBC: 252 ug/dL

## 2023-04-27 LAB — FERRITIN: Ferritin: 11 ng/mL (ref 11–307)

## 2023-04-27 NOTE — Progress Notes (Signed)
NO Venofer today per MD ?

## 2023-04-27 NOTE — Patient Instructions (Signed)
#  Recommend gentle iron [iron biglycinate; 28 mg ] 1 pill a day.  This pill is unlikely to cause stomach upset or cause constipation.

## 2023-04-27 NOTE — Progress Notes (Signed)
[facility] CONSULT NOTE  Patient Care Team: Emogene Morgan, MD as PCP - General (Family Medicine) Earna Coder, MD as Consulting Physician (Internal Medicine)  CHIEF COMPLAINTS/PURPOSE OF CONSULTATION: ANEMIA   HEMATOLOGY HISTORY:  # ANEMIA [June 2022- Ferritin-3.5; Hb- 8.3; MCv-73] EGD/Colonoscopy:none [awaiting GI EGD/ re: stomach ulcers]; Vaginal bleeding: previous heavy [3-4 months]- June 2022- CT A/P- Motion degraded.  No acute abnormality identified; Few small foci of lucency within the iliac bones. Indeterminate but without aggressive features. Could be related to CKD.   # CKD stage IV [[place] ~2010]/Proteinuria [No Kidney Bx]; Poorly controlled HTN; Hx of peptic ulcer [awaiting GI]   HISTORY OF PRESENTING ILLNESS: Alone.  Walking independently.  Gina Doyle 48 y.o.  female with above history of chronic kidney disease -stage IV/ severe anemia s/p IV venofer is here for a follow up.  Patient is having to have a bowel movement a lot for the past 2 weeks. Thinks attributed be lolipop drinks and sauerkraut.   Patient denies any blood in stools or black-colored stools.  No nausea no vomiting.  Patient admits to non-compliance with her oral iron.   Review of Systems  Constitutional:  Positive for malaise/fatigue. Negative for chills, diaphoresis, fever and weight loss.  HENT:  Negative for nosebleeds and sore throat.   Eyes:  Negative for double vision.  Respiratory:  Negative for cough, hemoptysis, sputum production, shortness of breath and wheezing.   Cardiovascular:  Negative for chest pain, palpitations, orthopnea and leg swelling.  Gastrointestinal:  Positive for abdominal pain. Negative for blood in stool, constipation, diarrhea, heartburn, melena, nausea and vomiting.  Genitourinary:  Negative for dysuria, frequency and urgency.  Musculoskeletal:  Positive for back pain. Negative for joint pain.  Skin: Negative.  Negative for itching and rash.   Neurological:  Negative for dizziness, tingling, focal weakness, weakness and headaches.  Endo/Heme/Allergies:  Does not bruise/bleed easily.  Psychiatric/Behavioral:  Negative for depression. The patient is not nervous/anxious and does not have insomnia.     MEDICAL HISTORY:  Past Medical History:  Diagnosis Date   CKD (chronic kidney disease)    GERD (gastroesophageal reflux disease)    History of COVID-19 03/2020   Hypertension    IDA (iron deficiency anemia)    Peptic ulcer disease     SURGICAL HISTORY: Past Surgical History:  Procedure Laterality Date   COLONOSCOPY WITH PROPOFOL N/A 05/26/2021   Procedure: COLONOSCOPY WITH PROPOFOL;  Surgeon: Toney Reil, MD;  Location: [facility];  Service: Endoscopy;  Laterality: N/A;   ESOPHAGOGASTRODUODENOSCOPY (EGD) WITH PROPOFOL N/A 05/26/2021   Procedure: ESOPHAGOGASTRODUODENOSCOPY (EGD) WITH PROPOFOL;  Surgeon: Toney Reil, MD;  Location: [facility];  Service: Endoscopy;  Laterality: N/A;    SOCIAL HISTORY: Social History   Socioeconomic History   Marital status: Single    Spouse name: Not on file   Number of children: Not on file   Years of education: Not on file   Highest education level: Not on file  Occupational History   Not on file  Tobacco Use   Smoking status: Every Day    Current packs/day: 0.25    Average packs/day: 0.3 packs/day for 14.0 years (3.5 ttl pk-yrs)    Types: Cigarettes   Smokeless tobacco: Never   Tobacco comments:    Started smoking age 56.  No cigs since 05/13/21  Vaping Use   Vaping status: Never Used  Substance and Sexual Activity   Alcohol use: Not Currently  Drug use: Not Currently   Sexual activity: Not on file  Other Topics Concern   Not on file  Social History Narrative   Lives in [place] with son. Smoke 5- cig/day; No alcohol; works at [employer].    Social Drivers of Corporate investment banker Strain: Not on file  Food Insecurity: Not on file   Transportation Needs: Not on file  Physical Activity: Not on file  Stress: Not on file  Social Connections: Not on file  Intimate Partner Violence: Not on file    FAMILY HISTORY: Family History  Problem Relation Age of Onset   Cancer Maternal Grandfather    Cancer Paternal Grandmother    Pancreatic cancer Paternal Grandmother     ALLERGIES:  has no known allergies.  MEDICATIONS:  Current Outpatient Medications  Medication Sig Dispense Refill   cyclobenzaprine (FLEXERIL) 5 MG tablet Take 1 tablet (5 mg total) by mouth 3 (three) times daily as needed for muscle spasms (back pain). (Patient not taking: Reported on 10/27/2022) 20 tablet 0   losartan-hydrochlorothiazide (HYZAAR) 100-25 MG tablet Take 1 tablet by mouth daily.     omeprazole (PRILOSEC) 40 MG capsule Take 1 capsule (40 mg total) by mouth 2 (two) times daily before a meal. 60 capsule 2   No current facility-administered medications for this visit.      PHYSICAL EXAMINATION:   Vitals:   04/27/23 1259  BP: (!) 148/90  Pulse: (!) 59  Resp: 18  Temp: 98 F (36.7 C)  SpO2: 99%     Filed Weights   04/27/23 1259  Weight: 203 lb (92.1 kg)      Physical Exam Vitals and nursing note reviewed.  Constitutional:      Comments: Ambulating: independently;   Alone  HENT:     Head: Normocephalic and atraumatic.     Mouth/Throat:     Pharynx: Oropharynx is clear.  Eyes:     Extraocular Movements: Extraocular movements intact.     Pupils: Pupils are equal, round, and reactive to light.  Cardiovascular:     Rate and Rhythm: Normal rate and regular rhythm.  Pulmonary:     Comments: Decreased breath sounds bilaterally.  Abdominal:     Palpations: Abdomen is soft.  Musculoskeletal:        General: Normal range of motion.     Cervical back: Normal range of motion.  Skin:    General: Skin is warm.  Neurological:     General: No focal deficit present.     Mental Status: She is alert and oriented to person,  place, and time.  Psychiatric:        Behavior: Behavior normal.        Judgment: Judgment normal.     LABORATORY DATA:  I have reviewed the data as listed Lab Results  Component Value Date   WBC 4.2 04/27/2023   HGB 13.1 04/27/2023   HCT 40.8 04/27/2023   MCV 94.4 04/27/2023   PLT 284 04/27/2023   Recent Labs    10/27/22 1350 04/27/23 1242  NA 136 136  K 4.4 4.8  CL 110 111  CO2 19* 19*  GLUCOSE 110* 91  BUN 22* 17  CREATININE 2.26* 2.00*  CALCIUM 10.2 9.9  GFRNONAA 26* 30*     No results found.  Symptomatic anemia #Iron deficient anemia [ferritin 3.5/hemoglobin 8.2-June 2022; [facility]]. S/p IV iron infusion- improved. AUG 2024- iron sat-6%; ferritin-6.   # Hb 13.4-Iron studies- Pending today.  Recommend HOLD  iron infusion. Recommend compliance with taking p.o. iron pills/gentle iron [Hx of constipation].   # CKD stage IV-[Dr.Murphy-] GFR-stable.   #Elevated blood pressure-150-140s at home. again recommend compliance with her blood pressure medications; close follow-up with nephrology. Stable.   #Active smoker:counselled to quit smoking/cutting back [3 cig/day]- stable.   # DISPOSITION: # HOLD Venofer today # follow up in 6 months MD; labs- cbc/bmp; Iron studies/ferritin; possible venfoer--Dr.B  All questions were answered. The patient knows to call the clinic with any problems, questions or concerns.    Earna Coder, MD 04/27/2023 1:26 PM

## 2023-04-27 NOTE — Progress Notes (Signed)
Patient is having to have a bowel movement a lot for the past 2 weeks, which she thinks it might be the lolipop drinks, so she is going to stop drinking them for a few days to see if that is what it is. She is also more fatigue.

## 2023-04-27 NOTE — Assessment & Plan Note (Addendum)
#  Iron deficient anemia [ferritin 3.5/hemoglobin 8.2-June 2022; [facility]]. S/p IV iron infusion- improved. AUG 2024- iron sat-6%; ferritin-6.   # Hb 13.4-Iron studies- Pending today.  Recommend HOLD iron infusion. Recommend compliance with taking p.o. iron pills/gentle iron [Hx of constipation].   # CKD stage IV-[Dr.Murphy-] GFR-stable.   #Elevated blood pressure-150-140s at home. again recommend compliance with her blood pressure medications; close follow-up with nephrology. Stable.   #Active smoker:counselled to quit smoking/cutting back [3 cig/day]- stable.   # DISPOSITION: # HOLD Venofer today # follow up in 6 months MD; labs- cbc/bmp; Iron studies/ferritin; possible venfoer--Dr.B

## 2023-06-13 ENCOUNTER — Encounter: Payer: Self-pay | Admitting: Internal Medicine

## 2023-08-06 ENCOUNTER — Emergency Department
Admission: EM | Admit: 2023-08-06 | Discharge: 2023-08-06 | Disposition: A | Discharge status: Home / Self Care | Payer: Self-pay | Attending: Emergency Medicine | Admitting: Emergency Medicine

## 2023-08-06 ENCOUNTER — Other Ambulatory Visit: Payer: Self-pay

## 2023-08-06 DIAGNOSIS — N760 Acute vaginitis: Secondary | ICD-10-CM | POA: Insufficient documentation

## 2023-08-06 DIAGNOSIS — N184 Chronic kidney disease, stage 4 (severe): Secondary | ICD-10-CM | POA: Insufficient documentation

## 2023-08-06 LAB — URINALYSIS, ROUTINE W REFLEX MICROSCOPIC
Bilirubin Urine: NEGATIVE
Glucose, UA: NEGATIVE mg/dL
Hgb urine dipstick: NEGATIVE
Ketones, ur: NEGATIVE mg/dL
Leukocytes,Ua: NEGATIVE
Nitrite: NEGATIVE
Protein, ur: 100 mg/dL — AB
Specific Gravity, Urine: 1.01 (ref 1.005–1.030)
pH: 6 (ref 5.0–8.0)

## 2023-08-06 LAB — BASIC METABOLIC PANEL WITH GFR
Anion gap: 5 (ref 5–15)
BUN: 20 mg/dL (ref 6–20)
CO2: 21 mmol/L — ABNORMAL LOW (ref 22–32)
Calcium: 9.7 mg/dL (ref 8.9–10.3)
Chloride: 114 mmol/L — ABNORMAL HIGH (ref 98–111)
Creatinine, Ser: 2.28 mg/dL — ABNORMAL HIGH (ref 0.44–1.00)
GFR, Estimated: 26 mL/min — ABNORMAL LOW (ref >60)
Glucose, Bld: 97 mg/dL (ref 70–99)
Potassium: 4.8 mmol/L (ref 3.5–5.1)
Sodium: 140 mmol/L (ref 135–145)

## 2023-08-06 LAB — WET PREP, GENITAL
Clue Cells Wet Prep HPF POC: NONE SEEN
Sperm: NONE SEEN
Trich, Wet Prep: NONE SEEN
WBC, Wet Prep HPF POC: <10 (ref <10)
Yeast Wet Prep HPF POC: NONE SEEN

## 2023-08-06 LAB — CHLAMYDIA/NGC RT PCR (ARMC ONLY)
Chlamydia Tr: NOT DETECTED
N gonorrhoeae: NOT DETECTED

## 2023-08-06 LAB — POC URINE PREG, ED: Preg Test, Ur: NEGATIVE

## 2023-08-06 LAB — HIV ANTIBODY (ROUTINE TESTING W REFLEX): HIV Screen 4th Generation wRfx: NONREACTIVE

## 2023-08-06 MED ORDER — ESTRADIOL 0.1 MG/GM VA CREA: 1.0000 | TOPICAL_CREAM | VAGINAL | 0 refills | Status: AC

## 2023-08-06 NOTE — ED Provider Notes (Signed)
 [facility] Provider Note    Event Date/Time   First MD Initiated Contact with Patient 08/06/23 1011     (approximate)   History   Vaginal Itching   HPI Gina Doyle is a 48 y.o. female presenting to the emergency department with vaginal odor x 2 to 3 weeks and vaginal itching for 2 to 3 days.  States many coworkers came to her with concerns about a vaginal odor.  Also reports some urinary leakage. she denies chest pain, abdominal pain, dysuria, hematuria, vomiting, vaginal bleeding, numbness, tingling.  Says she does not have any history of ovarian cyst or torsion.  She has had 2 children.  Did have history of trichomonas last year and does have past medical history of CKD, hemorrhoids.  She is not currently sexually active.  Last menstrual period was roughly 2 weeks ago.  She has been reporting going 3 to 4 months at a time without her and is currently going through menopause.   Physical Exam   Triage Vital Signs: ED Triage Vitals  Encounter Vitals Group     BP 08/06/23 1008 (!) 163/118     Systolic BP Percentile --      Diastolic BP Percentile --      Pulse Rate 08/06/23 1008 63     Resp 08/06/23 1008 16     Temp 08/06/23 1008 98.6 F (37 C)     Temp Source 08/06/23 1008 Oral     SpO2 08/06/23 1008 100 %     Weight 08/06/23 1009 202 lb 13.2 oz (92 kg)     Height --      Head Circumference --      Peak Flow --      Pain Score 08/06/23 1009 3     Pain Loc --      Pain Education --      Exclude from Growth Chart --     Most recent vital signs: Vitals:   08/06/23 1008  BP: (!) 163/118  Pulse: 63  Resp: 16  Temp: 98.6 F (37 C)  SpO2: 100%    General: Awake, no distress.  CV:  Good peripheral perfusion.  Resp:  Normal effort.  Abd:  Smooth, non-tender, no distention.  GU:   External genitalia: Normal female hair distribution. Labia minora without lesions, swelling, or discoloration. Labia majora has ~1 cm lesion on lower left side. No  vulvar masses or tenderness. Urethral meatus and vaginal introitus normal in appearance.  Speculum exam: Vaginal mucosa pink and moist, no lesions or discharge. Cervix is normal in color and shape, no erythema or lesions. Discharge is normal appearing. Cervical os is closed. No cervical motion tenderness. Bimanual exam: Anteverted uterus, smooth and non-tender. No adnexal masses. Slight tenderness with palpation of left adnexa.  Rectovaginal exam: Deferred.   ED Results / Procedures / Treatments   Labs (all labs ordered are listed, but only abnormal results are displayed) Labs Reviewed  URINALYSIS, ROUTINE W REFLEX MICROSCOPIC - Abnormal; Notable for the following components:      Result Value   Color, Urine YELLOW (*)    APPearance CLEAR (*)    Protein, ur 100 (*)    Bacteria, UA RARE (*)    All other components within normal limits  BASIC METABOLIC PANEL WITH GFR - Abnormal; Notable for the following components:   Chloride 114 (*)    CO2 21 (*)    Creatinine, Ser 2.28 (*)    GFR, Estimated 26 (*)  All other components within normal limits  WET PREP, GENITAL  CHLAMYDIA/NGC RT PCR ([facility] ONLY)            HSV CULTURE AND TYPING  HIV ANTIBODY (ROUTINE TESTING W REFLEX)  POC URINE PREG, ED     EKG  Not ordered.   RADIOLOGY   Not ordered.  PROCEDURES:  Critical Care performed: No  .Pelvic exam  Date/Time: 08/06/2023 11:11 AM  Performed by: Thomasenia Flesher, PA-C Authorized by: Thomasenia Flesher, PA-C  Consent: Verbal consent obtained. Risks and benefits: risks, benefits and alternatives were discussed Consent given by: patient Patient understanding: patient states understanding of the procedure being performed Test results: test results available and properly labeled Patient identity confirmed: verbally with patient Time out: Immediately prior to procedure a "time out" was called to verify the correct patient, procedure, equipment, support staff and site/side marked as  required. Preparation: Patient was prepped and draped in the usual sterile fashion. Local anesthesia used: no  Anesthesia: Local anesthesia used: no  Sedation: Patient sedated: no  Patient tolerance: patient tolerated the procedure well with no immediate complications      MEDICATIONS ORDERED IN ED: Medications - No data to display   IMPRESSION / MDM / ASSESSMENT AND PLAN / ED COURSE  I reviewed the triage vital signs and the nursing notes.                              Differential diagnosis includes, but is not limited to, HSV, bacterial vaginosis, trichomonas, vaginal candidiasis, fibroid, UTI, urinary incontinence, CKD exacerbation  Patient's presentation is most consistent with acute complicated illness / injury requiring diagnostic workup.  Patient is a 48 year old female who presented with vaginal odor x 2 to 3 weeks in addition to vaginal itching and some urinary leakage. Her main concern is the smell.  Pelvic exam was performed and revealed a small lesion left lower labia majora.  BMP is consistent with her CKD stage IV with a GFR of 26 and creatinine of 2.28 that was known by the patient.  She also has proteinuria of 100.  Wet prep showed no signs of trichomoniasis, yeast, BV.  PCR was negative for gonorrhea and chlamydia.  I independently interpreted and reviewed all of these labs. Patient was informed that HIV antibody and HSV culture test can take up to a few days to result.  I decided to give her 1 dose of the estradiol vaginal cream to help with her vaginal symptoms since I do believe she is going through menopause.  I also gave her Kegel exercises to help with potential bladder incontinence.  We also discussed how the smell could be due to her high-protein and she should eat a low protein diet. I would like her to follow-up with her primary care provider in a week or 2 should her symptoms not resolve, or if the HIV or HSV result is positive, or if there are any new or  concerning symptoms.   Emergency department return precautions were discussed with the patient.  Patient is in agreement to the treatment plan.  Patient is stable for discharge.     FINAL CLINICAL IMPRESSION(S) / ED DIAGNOSES   Final diagnoses:  Acute vaginitis  CKD (chronic kidney disease) stage 4, GFR 15-29 ml/min (HCC)     Rx / DC Orders   ED Discharge Orders          Ordered    estradiol (ESTRACE) 0.1  MG/GM vaginal cream  Weekly        08/06/23 1330             Note:  This document was prepared using Dragon voice recognition software and may include unintentional dictation errors.    Thomasenia Flesher, PA-C 08/06/23 1352    Kandee Orion, MD 08/06/23 [phone]

## 2023-08-06 NOTE — ED Triage Notes (Signed)
 C/O vaginal itching and odor x 2-3 days. Denies dysuria.

## 2023-08-06 NOTE — Discharge Instructions (Addendum)
 You were seen in the emergency department today for vaginitis and suspected bladder incontinence. You do not have gonorrhea, chlamydia, trichomonas, bacterial vaginosis, or a fungal infection. You do not have a UTI. Please read the attached instructions. I have attached exercises to help with strengthening your pelvic floor muscles.  Someone will call you if your remaining lab results are positive.  Please return to the emergency department if you experience chest pain, abdominal pain, ovarian pain, vomiting, heavy bleeding, or any other new or concerning symptom.

## 2023-08-09 LAB — HSV CULTURE AND TYPING

## 2023-09-25 ENCOUNTER — Other Ambulatory Visit: Payer: Self-pay

## 2023-09-25 ENCOUNTER — Emergency Department
Admission: EM | Admit: 2023-09-25 | Discharge: 2023-09-25 | Disposition: A | Discharge status: Home / Self Care | Payer: Self-pay

## 2023-09-25 DIAGNOSIS — I129 Hypertensive chronic kidney disease with stage 1 through stage 4 chronic kidney disease, or unspecified chronic kidney disease: Secondary | ICD-10-CM | POA: Insufficient documentation

## 2023-09-25 DIAGNOSIS — L750 Bromhidrosis: Secondary | ICD-10-CM | POA: Insufficient documentation

## 2023-09-25 DIAGNOSIS — R102 Pelvic and perineal pain: Secondary | ICD-10-CM | POA: Insufficient documentation

## 2023-09-25 DIAGNOSIS — N189 Chronic kidney disease, unspecified: Secondary | ICD-10-CM | POA: Insufficient documentation

## 2023-09-25 DIAGNOSIS — D631 Anemia in chronic kidney disease: Secondary | ICD-10-CM | POA: Insufficient documentation

## 2023-09-25 LAB — WET PREP, GENITAL
Clue Cells Wet Prep HPF POC: NONE SEEN
Sperm: NONE SEEN
Trich, Wet Prep: NONE SEEN
WBC, Wet Prep HPF POC: <10 (ref <10)
Yeast Wet Prep HPF POC: NONE SEEN

## 2023-09-25 LAB — CHLAMYDIA/NGC RT PCR (ARMC ONLY)
Chlamydia Tr: NOT DETECTED
N gonorrhoeae: NOT DETECTED

## 2023-09-25 LAB — CBC
HCT: 43.5 % (ref 36.0–46.0)
Hemoglobin: 13.6 g/dL (ref 12.0–15.0)
MCH: 31.3 pg (ref 26.0–34.0)
MCHC: 31.3 g/dL (ref 30.0–36.0)
MCV: 100 fL (ref 80.0–100.0)
Platelets: 232 K/uL (ref 150–400)
RBC: 4.35 MIL/uL (ref 3.87–5.11)
RDW: 15.9 % — ABNORMAL HIGH (ref 11.5–15.5)
WBC: 4.5 K/uL (ref 4.0–10.5)
nRBC: 0 % (ref 0.0–0.2)

## 2023-09-25 LAB — COMPREHENSIVE METABOLIC PANEL WITH GFR
ALT: 15 U/L (ref 0–44)
AST: 25 U/L (ref 15–41)
Albumin: 3.2 g/dL — ABNORMAL LOW (ref 3.5–5.0)
Alkaline Phosphatase: 83 U/L (ref 38–126)
Anion gap: 8 (ref 5–15)
BUN: 24 mg/dL — ABNORMAL HIGH (ref 6–20)
CO2: 17 mmol/L — ABNORMAL LOW (ref 22–32)
Calcium: 10.1 mg/dL (ref 8.9–10.3)
Chloride: 113 mmol/L — ABNORMAL HIGH (ref 98–111)
Creatinine, Ser: 2.53 mg/dL — ABNORMAL HIGH (ref 0.44–1.00)
GFR, Estimated: 23 mL/min — ABNORMAL LOW (ref >60)
Glucose, Bld: 95 mg/dL (ref 70–99)
Potassium: 5.1 mmol/L (ref 3.5–5.1)
Sodium: 138 mmol/L (ref 135–145)
Total Bilirubin: 0.9 mg/dL (ref 0.0–1.2)
Total Protein: 7.1 g/dL (ref 6.5–8.1)

## 2023-09-25 LAB — URINALYSIS, ROUTINE W REFLEX MICROSCOPIC
Bilirubin Urine: NEGATIVE
Glucose, UA: NEGATIVE mg/dL
Ketones, ur: NEGATIVE mg/dL
Leukocytes,Ua: NEGATIVE
Nitrite: NEGATIVE
Protein, ur: 100 mg/dL — AB
Specific Gravity, Urine: 1.011 (ref 1.005–1.030)
pH: 6 (ref 5.0–8.0)

## 2023-09-25 LAB — POC URINE PREG, ED: Preg Test, Ur: NEGATIVE

## 2023-09-25 LAB — LIPASE, BLOOD: Lipase: 33 U/L (ref 11–51)

## 2023-09-25 NOTE — ED Provider Notes (Signed)
 [facility] Provider Note    Event Date/Time   First MD Initiated Contact with Patient 09/25/23 0912     (approximate)   History   Abdominal Pain  Pt comes with lower pelvic pain for about 3-4 days. Pt state she has also noticed a foul odor, pain and itching with urination. Pt states discharge. Pt denies any N/V.   HPI Gina Doyle is a 48 y.o. female PMH hypertension, iron  deficiency anemia, PAD, CKD, GERD presents for evaluation of lower abdominal/pelvic discomfort as well as urinary symptoms and vaginal discharge -Per chart review, last seen in our emergency department on 08/06/2023 complaining of vaginal discomfort.  Noted to have small vaginal lesion, thought to be menopausal related, prescribed estradiol .  Gonorrhea/chlamydia, wet prep, HIV, HSV testing reviewed, unremarkable - Today, patient presents noting that she has been told at work that she has occasional foul-smelling odor that customers have complained of.  She says sometimes it smells like a dead animal or someone passed gas.  Has noted some whitish vaginal discharge and suspects smell may be coming from this area.  Currently has mild lower abdominal/suprapubic discomfort that is not notably different from baseline.  Sexually active with 1 female partner.  Prior history of trichomonas that she says was treated.  No other STIs noted. -Otherwise has been in usual state of health.  No fevers.     Physical Exam   Triage Vital Signs: ED Triage Vitals  Encounter Vitals Group     BP 09/25/23 0910 (!) 177/107     Girls Systolic BP Percentile --      Girls Diastolic BP Percentile --      Boys Systolic BP Percentile --      Boys Diastolic BP Percentile --      Pulse Rate 09/25/23 0910 70     Resp 09/25/23 0910 19     Temp 09/25/23 0910 98 F (36.7 C)     Temp src --      SpO2 09/25/23 0910 100 %     Weight 09/25/23 0908 200 lb (90.7 kg)     Height 09/25/23 0908 5' 9 (1.753 m)     Head  Circumference --      Peak Flow --      Pain Score 09/25/23 0908 4     Pain Loc --      Pain Education --      Exclude from Growth Chart --     Most recent vital signs: Vitals:   09/25/23 0910 09/25/23 1354  BP: (!) 177/107 (!) 192/107  Pulse: 70 64  Resp: 19 19  Temp: 98 F (36.7 C)   SpO2: 100% 100%     General: Awake, no distress.  Very well-appearing. CV:  Good peripheral perfusion. RRR, RP 2+ Resp:  Normal effort. CTAB Abd:  No distention.  Trace tenderness in suprapubic region only, no tenderness to deep palpation elsewhere. Pelvic:  Deferred on initial eval, see exam and ED course below    ED Results / Procedures / Treatments   Labs (all labs ordered are listed, but only abnormal results are displayed) Labs Reviewed  COMPREHENSIVE METABOLIC PANEL WITH GFR - Abnormal; Notable for the following components:      Result Value   Chloride 113 (*)    CO2 17 (*)    BUN 24 (*)    Creatinine, Ser 2.53 (*)    Albumin 3.2 (*)    GFR, Estimated 23 (*)  All other components within normal limits  CBC - Abnormal; Notable for the following components:   RDW 15.9 (*)    All other components within normal limits  URINALYSIS, ROUTINE W REFLEX MICROSCOPIC - Abnormal; Notable for the following components:   Color, Urine YELLOW (*)    APPearance HAZY (*)    Hgb urine dipstick LARGE (*)    Protein, ur 100 (*)    Bacteria, UA RARE (*)    All other components within normal limits  WET PREP, GENITAL  CHLAMYDIA/NGC RT PCR ([facility] ONLY)            LIPASE, BLOOD  POC URINE PREG, ED     EKG  N/a   RADIOLOGY N/a    PROCEDURES:  Critical Care performed: No  Procedures   MEDICATIONS ORDERED IN ED: Medications - No data to display   IMPRESSION / MDM / ASSESSMENT AND PLAN / ED COURSE  I reviewed the triage vital signs and the nursing notes.                              DDX/MDM/AP: Differential diagnosis includes, but is not limited to, bacterial vaginosis,  consider gonorrhea/chlamydia/PID, consider but doubt UTI.  Plan: - Labs - Pelvic exam   Patient's presentation is most consistent with acute complicated illness / injury requiring diagnostic workup.  ED course below.   Clinical Course as of 09/25/23 1437  Sun Sep 25, 2023  1012 Cbc wnl [MM]  1354 Pelvic exam performed, no CMT, no adnexal tenderness.  No discharge appreciated.  Mild bleeding from cervix, patient notes she recently started her menstrual cycle.  Swabs collected  Separately, patient notes that she has been feeling a mass when she poops recently.  Notes this goes away.  External exam with possible mild hemorrhoids externally, internal rectal exam unremarkable.  Consider possible hemorrhoids versus transient rectal prolapse.  Is already established with a GI provider, recommend she follow-up with them.  All exams chaperoned by female nurse. [MM]  1435 Patient reevaluated, discussed overall unremarkable workup  Reassured by unremarkable pelvic exam here, no clear evidence of bacterial vaginitis, PID, other pathology.  No obvious followed or appreciated either.  On further discussion, patient notes she has been gassier than usual and has more frequent stools ever since remote colonoscopy.  It is possible follow odor may be due to worsening smell of her stools.  Is already taking probiotics and eating yogurts.  No clear evidence of pathology today and odor is not clearly coming from vagina-stable for outpatient follow-up, recommend PMD.  ED return precautions in place.  Patient agrees with plan.  Also noted elevated blood pressure, asymptomatic, recommend PMD follow-up   [MM]    Clinical Course User Index [MM] Clarine Ozell LABOR, MD     FINAL CLINICAL IMPRESSION(S) / ED DIAGNOSES   Final diagnoses:  Abnormal body odor     Rx / DC Orders   ED Discharge Orders     None        Note:  This document was prepared using Dragon voice recognition software and may  include unintentional dictation errors.   Clarine Ozell LABOR, MD 09/25/23 1438

## 2023-09-25 NOTE — ED Triage Notes (Signed)
 Pt comes with lower pelvic pain for about 3-4 days. Pt state she has also noticed a foul odor, pain and itching with urination. Pt states discharge. Pt denies any N/V.

## 2023-09-25 NOTE — ED Notes (Signed)
 Assisting Dr. Clarine with pelvic exam at this time.

## 2023-09-25 NOTE — Discharge Instructions (Signed)
 Your evaluation in the emergency department was overall reassuring.  As discussed, I am unsure as to the cause of the odor that has been sometimes noticed recently, but your vaginal exam was reassuring and your laboratory workup was all at baseline.  Your blood pressure was somewhat elevated here, I recommend you discuss this further with your primary care provider.  Return to the emergency department with any new or worsening symptoms.

## 2023-10-14 ENCOUNTER — Encounter: Payer: Self-pay | Admitting: Nurse Practitioner

## 2023-10-14 ENCOUNTER — Ambulatory Visit: Payer: Self-pay

## 2023-10-14 DIAGNOSIS — Z113 Encounter for screening for infections with a predominantly sexual mode of transmission: Secondary | ICD-10-CM

## 2023-10-14 LAB — WET PREP FOR TRICH, YEAST, CLUE
Clue Cell Exam: NEGATIVE
Trichomonas Exam: NEGATIVE
Yeast Exam: NEGATIVE

## 2023-10-14 LAB — HM HIV SCREENING LAB: HM HIV Screening: NEGATIVE

## 2023-10-14 NOTE — Progress Notes (Signed)
 In house lab results reviewed during visit. Gery Kubas RN

## 2023-10-14 NOTE — Progress Notes (Signed)
 [facility] Department STI clinic [address], [address] [place] [address] Main phone: [phone]  STI screening visit  Subjective:  Gina Doyle is a 48 y.o. female being seen today for an STI screening visit. The patient reports they do have symptoms.  Patient reports that they do not desire a pregnancy in the next year.   They reported they are not interested in discussing contraception today.    Patient's last menstrual period was 08/27/2023.  Patient has the following medical conditions:  Patient Active Problem List   Diagnosis Date Noted   Symptomatic anemia 10/21/2021   Duodenal ulcer    Multiple polyps of sigmoid colon    Iron  deficiency anemia 09/17/2020   Chief Complaint  Patient presents with   STI Screening    HPI Patient reports that her coworkers and customers told her she smelled different this last month. Her partner denies that she smells different. She denies that the odor is worse during or after sex. She describes the odor as sewage. She also reports itching on her mons pubis for about a week. She has tried boric acid, but that made the smell worse. She is taking a probiotic. She was seen in the ED for this on 09/25/23 with no abnormal test results or exams.  Does the patient using douching products? No  See flowsheet for further details and programmatic requirements Hyperlink available at the top of the signed note in blue.  Flow sheet content below:  Pregnancy Intention Screening Does the patient want to become pregnant in the next year?: No Does the patient's partner want to become pregnant in the next year?: No Would the patient like to discuss contraceptive options today?: No All Patients Anyone smoke around pt and/or pt's children?: No Anyone smoke inside pt's house?: No Anyone smoke inside car?: No Anyone smoke inside the workplace?: No Reason For STD Screen STD Screening: Has symptoms Have you ever had an STD?:  Yes History of Antibiotic use in the past 2 weeks?: No STD Symptoms Genital Itching: Yes Genital Itch s/s: externally since this week Other s/s: odor Risk Factors for Hep B Household, sexual, or needle sharing contact of a person infected with Hep B: No Sexual contact with a person who uses drugs not as prescribed?: No Currently or Ever used drugs not as prescribed: No HIV Positive: No PRep Patient: No Men who have sex with men: No Have Hepatitis C: No History of Incarceration: No History of Homeslessness?: No Anal sex following anal drug use?: No Risk Factors for Hep C Currently using drugs not as prescribed: No Sexual partner(s) currently using drugs as not prescribed: No History of drug use: No HIV Positive: No People with a history of incarceration: No People born between the years of [birth year] and 36: No Advise Advised client to quit or stay quit. : Yes Assess # of cigarettes per day: 8 Is patient ready to quit in next 30 days?: No Abuse History Has patient ever been abused physically?: No Has patient ever been abused sexually?: No Does patient feel they have a problem with Anxiety?: No Does patient feel they have a problem with Depression?: No Counseling BCM given today through FP clinic: No Immunizations: Immunization history assessed Test results given to patient Patient questions answered: Yes STD Treatment Nursing Actions: Patient counseled to use condoms with all sex - Condoms given Contraception Wrap Up Current Method: Female Sterilization End Method: Female Sterilization Contraception Counseling Provided: No How was the end  contraceptive method provided?: N/A   Screening for MPX risk: Does the patient have an unexplained rash? No Is the patient MSM? No Does the patient endorse multiple sex partners or anonymous sex partners? No Did the patient have close or sexual contact with a person diagnosed with MPX? No Has the patient traveled outside the [place]  where  MPX is endemic? No Is there a high clinical suspicion for MPX-- evidenced by one of the following No  -Unlikely to be chickenpox  -Lymphadenopathy  -Rash that present in same phase of evolution on any given body part  Screenings: Last HIV test per patient/review of record was No results found for: HMHIVSCREEN  Lab Results  Component Value Date   HIV Non Reactive 08/06/2023     Last HEPC test per patient/review of record was No results found for: HMHEPCSCREEN No components found for: HEPC   Last HEPB test per patient/review of record was No components found for: HMHEPBSCREEN   Patient reports last pap was:   No results found for: SPECADGYN No Cervical Cancer Screening results to display.  Immunization history:   There is no immunization history on file for this patient.  The following portions of the patient's history were reviewed and updated as appropriate: allergies, current medications, past medical history, past social history, past surgical history and problem list.  Objective:  There were no vitals filed for this visit.  Physical Exam Vitals and nursing note reviewed. Exam conducted with a chaperone present Orrin, Charity fundraiser).  Constitutional:      Appearance: Normal appearance.  HENT:     Head: Normocephalic and atraumatic.     Comments: Oral swab collected    Mouth/Throat:     Mouth: Mucous membranes are moist.     Pharynx: Oropharynx is clear. No oropharyngeal exudate or posterior oropharyngeal erythema.  Pulmonary:     Effort: Pulmonary effort is normal.  Abdominal:     General: Abdomen is flat.     Palpations: There is no mass.     Tenderness: There is no abdominal tenderness. There is no rebound.  Genitourinary:    General: Normal vulva.     Exam position: Lithotomy position.     Pubic Area: No rash or pubic lice.      Labia:        Right: No rash or lesion.        Left: No rash or lesion.      Vagina: Normal. No vaginal discharge, erythema,  bleeding or lesions.     Cervix: No cervical motion tenderness, discharge, friability, lesion or erythema.     Rectum: Normal.     Comments: pH = 2-3 Lymphadenopathy:     Head:     Right side of head: No preauricular or posterior auricular adenopathy.     Left side of head: No preauricular or posterior auricular adenopathy.     Cervical: No cervical adenopathy.     Upper Body:     Right upper body: No supraclavicular, axillary or epitrochlear adenopathy.     Left upper body: No supraclavicular, axillary or epitrochlear adenopathy.     Lower Body: No right inguinal adenopathy. No left inguinal adenopathy.  Skin:    General: Skin is warm and dry.     Findings: No rash.  Neurological:     Mental Status: She is alert and oriented to person, place, and time.  Psychiatric:        Mood and Affect: Mood normal.  Behavior: Behavior normal.      Assessment and Plan:  Gina Doyle is a 48 y.o. female presenting to the [facility] Department for STI screening  1. Screening for venereal disease (Primary)  Pt pointed to where she is itching on the mons pubis.No irritation, lesion, or rash found externally. She was seen in the ED for abnormal odor on 09/25/23 with no abnormal test results or exams.  Discussed that if tests are WNL she may consider other sources of odor.  - Syphilis Serology, [place] [facility] - HIV [place] [facility] - Chlamydia/Gonorrhea [place] [facility] - WET PREP FOR TRICH, YEAST, CLUE - Gonococcus culture   Patient accepted the following screenings: oral GC culture, vaginal CT/GC swab, vaginal wet prep, HIV, and RPR Patient meets criteria for HepB screening? No. Ordered? no Patient meets criteria for HepC screening? No. Ordered? no  Treat wet prep per standing order Discussed time line for State Lab results and that patient will be called with positive results and encouraged patient to call if she had not heard in 2 weeks.  Counseled to return or seek care for  continued or worsening symptoms Recommended repeat testing in 3 months with positive results. Recommended condom use with all sex for STI prevention.   Patient is currently using Sterilization by Laparoscopy to prevent pregnancy.    Return in about 3 months (around 01/14/2024), or if symptoms worsen or fail to improve.  Future Appointments  Date Time Provider Department Center  10/25/2023 12:45 PM [facility] None  10/25/2023  1:15 PM Brahmanday, Govinda R, MD [facility] None  10/25/2023  1:45 PM [facility] INFUSION CHAIR 1 [facility] None    Leita MARLA Bolognese, NP

## 2023-10-17 ENCOUNTER — Ambulatory Visit: Payer: Self-pay

## 2023-10-18 LAB — GONOCOCCUS CULTURE

## 2023-10-21 ENCOUNTER — Encounter: Payer: Self-pay | Admitting: Nurse Practitioner

## 2023-10-25 ENCOUNTER — Inpatient Hospital Stay: Payer: Medicaid Other | Admitting: Internal Medicine

## 2023-10-25 ENCOUNTER — Inpatient Hospital Stay: Payer: Medicaid Other

## 2023-10-25 ENCOUNTER — Other Ambulatory Visit: Payer: Medicaid Other

## 2023-11-13 ENCOUNTER — Emergency Department: Payer: Self-pay

## 2023-11-13 ENCOUNTER — Other Ambulatory Visit: Payer: Self-pay

## 2023-11-13 ENCOUNTER — Emergency Department
Admission: EM | Admit: 2023-11-13 | Discharge: 2023-11-13 | Disposition: A | Discharge status: Home / Self Care | Payer: Self-pay | Attending: Emergency Medicine | Admitting: Emergency Medicine

## 2023-11-13 DIAGNOSIS — N189 Chronic kidney disease, unspecified: Secondary | ICD-10-CM | POA: Insufficient documentation

## 2023-11-13 DIAGNOSIS — K649 Unspecified hemorrhoids: Secondary | ICD-10-CM

## 2023-11-13 DIAGNOSIS — K529 Noninfective gastroenteritis and colitis, unspecified: Secondary | ICD-10-CM | POA: Insufficient documentation

## 2023-11-13 DIAGNOSIS — R1013 Epigastric pain: Secondary | ICD-10-CM

## 2023-11-13 DIAGNOSIS — I129 Hypertensive chronic kidney disease with stage 1 through stage 4 chronic kidney disease, or unspecified chronic kidney disease: Secondary | ICD-10-CM | POA: Insufficient documentation

## 2023-11-13 DIAGNOSIS — K644 Residual hemorrhoidal skin tags: Secondary | ICD-10-CM | POA: Insufficient documentation

## 2023-11-13 LAB — CBC
HCT: 44.3 % (ref 36.0–46.0)
Hemoglobin: 14.9 g/dL (ref 12.0–15.0)
MCH: 32.3 pg (ref 26.0–34.0)
MCHC: 33.6 g/dL (ref 30.0–36.0)
MCV: 95.9 fL (ref 80.0–100.0)
Platelets: 306 K/uL (ref 150–400)
RBC: 4.62 MIL/uL (ref 3.87–5.11)
RDW: 15.1 % (ref 11.5–15.5)
WBC: 4.6 K/uL (ref 4.0–10.5)
nRBC: 0 % (ref 0.0–0.2)

## 2023-11-13 LAB — URINALYSIS, ROUTINE W REFLEX MICROSCOPIC
Bacteria, UA: NONE SEEN
Bilirubin Urine: NEGATIVE
Glucose, UA: NEGATIVE mg/dL
Hgb urine dipstick: NEGATIVE
Ketones, ur: NEGATIVE mg/dL
Leukocytes,Ua: NEGATIVE
Nitrite: NEGATIVE
Protein, ur: 100 mg/dL — AB
Specific Gravity, Urine: 1.011 (ref 1.005–1.030)
pH: 6 (ref 5.0–8.0)

## 2023-11-13 LAB — COMPREHENSIVE METABOLIC PANEL WITH GFR
ALT: 17 U/L (ref 0–44)
AST: 23 U/L (ref 15–41)
Albumin: 3.7 g/dL (ref 3.5–5.0)
Alkaline Phosphatase: 104 U/L (ref 38–126)
Anion gap: 8 (ref 5–15)
BUN: 30 mg/dL — ABNORMAL HIGH (ref 6–20)
CO2: 17 mmol/L — ABNORMAL LOW (ref 22–32)
Calcium: 10.5 mg/dL — ABNORMAL HIGH (ref 8.9–10.3)
Chloride: 115 mmol/L — ABNORMAL HIGH (ref 98–111)
Creatinine, Ser: 2.16 mg/dL — ABNORMAL HIGH (ref 0.44–1.00)
GFR, Estimated: 28 mL/min — ABNORMAL LOW (ref >60)
Glucose, Bld: 94 mg/dL (ref 70–99)
Potassium: 4.7 mmol/L (ref 3.5–5.1)
Sodium: 140 mmol/L (ref 135–145)
Total Bilirubin: 0.8 mg/dL (ref 0.0–1.2)
Total Protein: 8 g/dL (ref 6.5–8.1)

## 2023-11-13 LAB — POC URINE PREG, ED: Preg Test, Ur: NEGATIVE

## 2023-11-13 LAB — LIPASE, BLOOD: Lipase: 43 U/L (ref 11–51)

## 2023-11-13 MED ORDER — LIDOCAINE 3 % EX CREA: TOPICAL_CREAM | CUTANEOUS | 0 refills | Status: AC

## 2023-11-13 MED ORDER — FAMOTIDINE 20 MG PO TABS: 20.0000 mg | ORAL_TABLET | Freq: Two times a day (BID) | ORAL | 0 refills | Status: DC

## 2023-11-13 MED ORDER — ONDANSETRON 4 MG PO TBDP: 4.0000 mg | ORAL_TABLET | Freq: Three times a day (TID) | ORAL | 0 refills | Status: AC | PRN

## 2023-11-13 MED ORDER — ONDANSETRON HCL 4 MG/2ML IJ SOLN
4.0000 mg | Freq: Once | INTRAMUSCULAR | Status: AC
  Administered 2023-11-13: 4 mg via INTRAVENOUS
  Filled 2023-11-13: qty 2

## 2023-11-13 MED ORDER — MORPHINE SULFATE (PF) 4 MG/ML IV SOLN
4.0000 mg | Freq: Once | INTRAVENOUS | Status: AC
  Administered 2023-11-13: 4 mg via INTRAVENOUS
  Filled 2023-11-13: qty 1

## 2023-11-13 MED ORDER — DICYCLOMINE HCL 10 MG PO CAPS: 10.0000 mg | ORAL_CAPSULE | Freq: Three times a day (TID) | ORAL | 0 refills | Status: AC | PRN

## 2023-11-13 NOTE — Discharge Instructions (Addendum)
 Take the Pepcid  twice daily for the next 1 to 2 weeks.  Use the Bentyl  as needed for abdominal cramping and the Zofran  as needed for nausea.  The lidocaine  is for the hemorrhoid, to apply directly if you have pain.  Return to the ER for new, worsening, or persistent severe abdominal pain, vomiting, fever, blood in your stool, or any other new or worsening symptoms that concern you.

## 2023-11-13 NOTE — ED Notes (Signed)
 Dr Jacolyn chaperoned by this RN during pts rectal exam

## 2023-11-13 NOTE — ED Triage Notes (Signed)
 Pt c/o abdominal pain x2 days with n/v and diarrhea. Pt has hx of ulcers and pylori in her stomach. Pt has hypertension with ckd. Pt did not take her medication today

## 2023-11-13 NOTE — ED Provider Notes (Signed)
 [facility] Provider Note    Event Date/Time   First MD Initiated Contact with Patient 11/13/23 1459     (approximate)   History   Abdominal Pain   HPI  Gina Doyle is a 48 y.o. female with a history of hypertension, CKD, PAD, iron  deficiency anemia, GERD who presents with epigastric pain for the last 2 days, gradual onset, worsening course, associate with nausea but no vomiting.  She has had some diarrhea as well over the last couple of weeks.  She states that the pain is similar to when she was diagnosed with PUD and treated for H. pylori a few years ago.  She denies any lower abdominal pain.  She also has a bump or lesion on her anus which has been increasingly painful over the last couple of days.  She denies any rectal bleeding.  I reviewed the past medical records.  The patient's most recent outpatient counter was on 8/1 with [facility] department for STI screening.  Previously she was seen in the ED on 7/13 with lower abdominal/pelvic discomfort and urinary symptoms.   Physical Exam   Triage Vital Signs: ED Triage Vitals  Encounter Vitals Group     BP 11/13/23 1346 (!) 217/132     Girls Systolic BP Percentile --      Girls Diastolic BP Percentile --      Boys Systolic BP Percentile --      Boys Diastolic BP Percentile --      Pulse Rate 11/13/23 1346 74     Resp 11/13/23 1346 18     Temp 11/13/23 1346 97.7 F (36.5 C)     Temp Source 11/13/23 1346 Oral     SpO2 11/13/23 1346 97 %     Weight 11/13/23 1346 197 lb (89.4 kg)     Height 11/13/23 1346 5' 9 (1.753 m)     Head Circumference --      Peak Flow --      Pain Score 11/13/23 1351 9     Pain Loc --      Pain Education --      Exclude from Growth Chart --     Most recent vital signs: Vitals:   11/13/23 1350 11/13/23 1636  BP: (!) 217/113 (!) 179/97  Pulse: 63 64  Resp: 18 18  Temp: 97.7 F (36.5 C) 98 F (36.7 C)  SpO2: 97% 100%     General: Awake, no  distress.  CV:  Good peripheral perfusion.  Resp:  Normal effort.  Abd:  Soft with epigastric tenderness.  No distention.  Other:  DRE reveals a small nonthrombosed external hemorrhoid which is mildly tender.  There is no erythema, induration, or abnormal warmth.  There is no fluctuance.  There is no open wound or fissure.   ED Results / Procedures / Treatments   Labs (all labs ordered are listed, but only abnormal results are displayed) Labs Reviewed  COMPREHENSIVE METABOLIC PANEL WITH GFR - Abnormal; Notable for the following components:      Result Value   Chloride 115 (*)    CO2 17 (*)    BUN 30 (*)    Creatinine, Ser 2.16 (*)    Calcium 10.5 (*)    GFR, Estimated 28 (*)    All other components within normal limits  URINALYSIS, ROUTINE W REFLEX MICROSCOPIC - Abnormal; Notable for the following components:   Color, Urine YELLOW (*)    APPearance CLEAR (*)  Protein, ur 100 (*)    All other components within normal limits  LIPASE, BLOOD  CBC  POC URINE PREG, ED     EKG     RADIOLOGY  CT abdomen/pelvis: I independently viewed and interpreted the images; there are no dilated bowel loops or any free air or free fluid.  Radiology report indicates the following:  IMPRESSION:  1. Mild wall thickening with mesenteric edema involving small bowel  in the right abdomen, suspicious for enteritis.  2. Slightly bulbous appearance of the uterus possible fibroids.    Aortic Atherosclerosis (ICD10-I70.0).     PROCEDURES:  Critical Care performed: No  Procedures   MEDICATIONS ORDERED IN ED: Medications  ondansetron  (ZOFRAN ) injection 4 mg (4 mg Intravenous Given 11/13/23 1647)  morphine  (PF) 4 MG/ML injection 4 mg (4 mg Intravenous Given 11/13/23 1648)     IMPRESSION / MDM / ASSESSMENT AND PLAN / ED COURSE  I reviewed the triage vital signs and the nursing notes.  48 year old female with PMH as noted above presents with epigastric pain for the last 2 days, also  reporting some anal pain and a lesion.  On exam she has hypertension but is asymptomatic from this.  Other vital signs are normal.  She has some epigastric tenderness.  The anal lesion appears to be a nonthrombosed hemorrhoid with no acute complication.  Differential diagnosis includes, but is not limited to, gastritis, PUD, gastroparesis, gastroenteritis, pancreatitis, gallstones, other hepatobiliary cause, colitis, diverticulitis.  CBC is normal.  CMP shows no acute findings.  Lipase is negative.  Urinalysis is unremarkable.  We will obtain a CT for further evaluation.  Patient's presentation is most consistent with acute complicated illness / injury requiring diagnostic workup.  ----------------------------------------- 5:29 PM on 11/13/2023 -----------------------------------------  CT shows likely enteritis.  Patient's symptoms have significantly improved.  At this time, she is stable for discharge home.  She is tolerating p.o.  Her blood pressure has also improved.  I counseled her on the results of the workup.  I have prescribed Zofran , Pepcid , and Bentyl  for symptomatic treatment as well as lidocaine  cream for the hemorrhoid.  I gave strict return precautions, and she expressed understanding.   FINAL CLINICAL IMPRESSION(S) / ED DIAGNOSES   Final diagnoses:  Epigastric pain  Enteritis  Hemorrhoids, unspecified hemorrhoid type     Rx / DC Orders   ED Discharge Orders          Ordered    dicyclomine  (BENTYL ) 10 MG capsule  Every 8 hours PRN        11/13/23 1728    ondansetron  (ZOFRAN -ODT) 4 MG disintegrating tablet  Every 8 hours PRN        11/13/23 1728    famotidine  (PEPCID ) 20 MG tablet  2 times daily        11/13/23 1728    Lidocaine  3 % CREA        11/13/23 1728             Note:  This document was prepared using Dragon voice recognition software and may include unintentional dictation errors.    Jacolyn Pae, MD 11/13/23 1729

## 2023-11-13 NOTE — ED Notes (Signed)
 ED provider notified of pts BP of 179/97.

## 2023-11-24 ENCOUNTER — Encounter: Payer: Self-pay | Admitting: Family Medicine

## 2023-12-21 ENCOUNTER — Inpatient Hospital Stay: Payer: Self-pay

## 2023-12-21 ENCOUNTER — Encounter: Payer: Self-pay | Admitting: Internal Medicine

## 2023-12-21 ENCOUNTER — Inpatient Hospital Stay: Payer: Self-pay | Admitting: Internal Medicine

## 2023-12-21 ENCOUNTER — Inpatient Hospital Stay: Payer: Self-pay | Attending: Internal Medicine

## 2023-12-21 VITALS — BP 159/110 | HR 57 | Temp 96.4°F | Resp 16 | Ht 69.0 in | Wt 193.0 lb

## 2023-12-21 DIAGNOSIS — R109 Unspecified abdominal pain: Secondary | ICD-10-CM | POA: Insufficient documentation

## 2023-12-21 DIAGNOSIS — R197 Diarrhea, unspecified: Secondary | ICD-10-CM | POA: Insufficient documentation

## 2023-12-21 DIAGNOSIS — Z800 Family history of malignant neoplasm of digestive organs: Secondary | ICD-10-CM | POA: Insufficient documentation

## 2023-12-21 DIAGNOSIS — D649 Anemia, unspecified: Secondary | ICD-10-CM

## 2023-12-21 DIAGNOSIS — F17210 Nicotine dependence, cigarettes, uncomplicated: Secondary | ICD-10-CM | POA: Insufficient documentation

## 2023-12-21 DIAGNOSIS — D509 Iron deficiency anemia, unspecified: Secondary | ICD-10-CM | POA: Insufficient documentation

## 2023-12-21 DIAGNOSIS — R350 Frequency of micturition: Secondary | ICD-10-CM | POA: Insufficient documentation

## 2023-12-21 DIAGNOSIS — N184 Chronic kidney disease, stage 4 (severe): Secondary | ICD-10-CM | POA: Insufficient documentation

## 2023-12-21 DIAGNOSIS — I129 Hypertensive chronic kidney disease with stage 1 through stage 4 chronic kidney disease, or unspecified chronic kidney disease: Secondary | ICD-10-CM | POA: Insufficient documentation

## 2023-12-21 DIAGNOSIS — Z79899 Other long term (current) drug therapy: Secondary | ICD-10-CM | POA: Insufficient documentation

## 2023-12-21 DIAGNOSIS — D631 Anemia in chronic kidney disease: Secondary | ICD-10-CM | POA: Insufficient documentation

## 2023-12-21 LAB — CBC WITH DIFFERENTIAL (CANCER CENTER ONLY)
Abs Immature Granulocytes: 0.02 K/uL (ref 0.00–0.07)
Basophils Absolute: 0 K/uL (ref 0.0–0.1)
Basophils Relative: 1 %
Eosinophils Absolute: 0.2 K/uL (ref 0.0–0.5)
Eosinophils Relative: 6 %
HCT: 40.8 % (ref 36.0–46.0)
Hemoglobin: 13.5 g/dL (ref 12.0–15.0)
Immature Granulocytes: 1 %
Lymphocytes Relative: 31 %
Lymphs Abs: 1.3 K/uL (ref 0.7–4.0)
MCH: 31.5 pg (ref 26.0–34.0)
MCHC: 33.1 g/dL (ref 30.0–36.0)
MCV: 95.3 fL (ref 80.0–100.0)
Monocytes Absolute: 0.4 K/uL (ref 0.1–1.0)
Monocytes Relative: 9 %
Neutro Abs: 2.3 K/uL (ref 1.7–7.7)
Neutrophils Relative %: 52 %
Platelet Count: 311 K/uL (ref 150–400)
RBC: 4.28 MIL/uL (ref 3.87–5.11)
RDW: 14.8 % (ref 11.5–15.5)
WBC Count: 4.3 K/uL (ref 4.0–10.5)
nRBC: 0 % (ref 0.0–0.2)

## 2023-12-21 LAB — IRON AND TIBC
Iron: 55 ug/dL (ref 28–170)
Saturation Ratios: 17 % (ref 10.4–31.8)
TIBC: 333 ug/dL (ref 250–450)
UIBC: 278 ug/dL

## 2023-12-21 LAB — BASIC METABOLIC PANEL - CANCER CENTER ONLY
Anion gap: 6 (ref 5–15)
BUN: 31 mg/dL — ABNORMAL HIGH (ref 6–20)
CO2: 18 mmol/L — ABNORMAL LOW (ref 22–32)
Calcium: 9.8 mg/dL (ref 8.9–10.3)
Chloride: 110 mmol/L (ref 98–111)
Creatinine: 2.53 mg/dL — ABNORMAL HIGH (ref 0.44–1.00)
GFR, Estimated: 23 mL/min — ABNORMAL LOW (ref >60)
Glucose, Bld: 92 mg/dL (ref 70–99)
Potassium: 4.6 mmol/L (ref 3.5–5.1)
Sodium: 134 mmol/L — ABNORMAL LOW (ref 135–145)

## 2023-12-21 LAB — FERRITIN: Ferritin: 9 ng/mL — ABNORMAL LOW (ref 11–307)

## 2023-12-21 NOTE — Assessment & Plan Note (Addendum)
#  Iron  deficient anemia [ferritin 3.5/hemoglobin 8.2-June 2022; [facility] nephrology]. S/p IV iron  infusion- improved. AUG 2024- iron  sat-6%; ferritin-6. Iron  deficiency on oral iron  therapy Iron  deficiency managed with oral iron  supplements. Hemoglobin stable at 13.5, indicating good response. - Continue oral iron  supplementation as needed. - Monitor hemoglobin levels annually.  # Chronic diarrhea and abdominal pain- Chronic diarrhea and abdominal pain for two months with CT scan indicating small bowel infection and possible fistula. - Contact primary care physician to discuss diarrhea and abdominal pain.  Defer to PCP- Arrange referral to gastroenterologist for further evaluation.  # Chronic kidney disease stage 4 [[Dr.Murphy-]]-  Diarrhea may affect kidney function, but current labs show no significant changes. - Ensure follow-up with nephrologist at [facility].  #Elevated blood pressure-150-140s at home. again recommend compliance with her blood pressure medications; close follow-up with nephrology. Stable.   #Active smoker:counselled to quit smoking/cutting back [3 cig/day]- stable.   # DISPOSITION: # HOLD Venofer  today # follow up in 12 months MD; labs- cbc/bmp; Iron  studies/ferritin; possible venfoer--Dr.B

## 2023-12-21 NOTE — Progress Notes (Signed)
 Patient here for follow up; patient states she has had diarrhea x 2 months. Patient states she sleeps all the time. Seen PCP yesterday and spoke to her about concerns and elevated BP

## 2023-12-21 NOTE — Progress Notes (Signed)
  [facility] CONSULT NOTE  Patient Care Team: Lorel Maxie LABOR, MD as PCP - General (Family Medicine) Rennie Cindy SAUNDERS, MD as Consulting Physician (Oncology)  CHIEF COMPLAINTS/PURPOSE OF CONSULTATION: ANEMIA   HEMATOLOGY HISTORY:  # ANEMIA [June 2022- Ferritin-3.5; Hb- 8.3; MCv-73] EGD/Colonoscopy:none [awaiting GI EGD/ re: stomach ulcers]; Vaginal bleeding: previous heavy [3-4 months]- June 2022- CT A/P- Motion degraded.  No acute abnormality identified; Few small foci of lucency within the iliac bones. Indeterminate but without aggressive features. Could be related to CKD.   # CKD stage IV [[place]  ~2010]/Proteinuria [No Kidney Bx]; Poorly controlled HTN; Hx of peptic ulcer [awaiting GI]   HISTORY OF PRESENTING ILLNESS: Alone.  Walking independently.  Gina Doyle 48 y.o.  female with above history of chronic kidney disease -stage IV/ severe anemia s/p IV venofer  is here for a follow up.  Patient here for follow up; patient states she has had diarrhea x 2 months. Patient states she sleeps all the time. Seen PCP yesterday and spoke to her about concerns and elevated BP.  She has been experiencing significant abdominal pain and continuous diarrhea for the past two months, with no solid bowel movements. A recent emergency room visit revealed a small bowel infection, but antibiotics were not prescribed. A CT scan was performed during her recent emergency room visit. She was told she had a small bowel infection, and she reports being told by her provider that a fistula was suspected.  She has chronic kidney disease, currently at stage four. Recent lab results show normal potassium levels, slightly low sodium, and a hemoglobin level of 13.5. She takes iron  supplements intermittently and has recently started consuming liver and kimchi to improve her health, despite disliking liver since childhood.  She reports increased urinary frequency, which has been noticed by her boss.  She is currently taking losartan. No recent menstrual cycles in the past six months.  Review of Systems  Constitutional:  Positive for malaise/fatigue. Negative for chills, diaphoresis, fever and weight loss.  HENT:  Negative for nosebleeds and sore throat.   Eyes:  Negative for double vision.  Respiratory:  Negative for cough, hemoptysis, sputum production, shortness of breath and wheezing.   Cardiovascular:  Negative for chest pain, palpitations, orthopnea and leg swelling.  Gastrointestinal:  Positive for abdominal pain. Negative for blood in stool, constipation, diarrhea, heartburn, melena, nausea and vomiting.  Genitourinary:  Negative for dysuria, frequency and urgency.  Musculoskeletal:  Positive for back pain. Negative for joint pain.  Skin: Negative.  Negative for itching and rash.  Neurological:  Negative for dizziness, tingling, focal weakness, weakness and headaches.  Endo/Heme/Allergies:  Does not bruise/bleed easily.  Psychiatric/Behavioral:  Negative for depression. The patient is not nervous/anxious and does not have insomnia.     MEDICAL HISTORY:  Past Medical History:  Diagnosis Date   CKD (chronic kidney disease)    GERD (gastroesophageal reflux disease)    History of COVID-19 03/2020   Hypertension    IDA (iron  deficiency anemia)    Peptic ulcer disease     SURGICAL HISTORY: Past Surgical History:  Procedure Laterality Date   COLONOSCOPY WITH PROPOFOL  N/A 05/26/2021   Procedure: COLONOSCOPY WITH PROPOFOL ;  Surgeon: Unk Corinn Skiff, MD;  Location: [facility];  Service: Endoscopy;  Laterality: N/A;   ESOPHAGOGASTRODUODENOSCOPY (EGD) WITH PROPOFOL  N/A 05/26/2021   Procedure: ESOPHAGOGASTRODUODENOSCOPY (EGD) WITH PROPOFOL ;  Surgeon: Unk Corinn Skiff, MD;  Location: [facility];  Service: Endoscopy;  Laterality: N/A;    SOCIAL  HISTORY: Social History   Socioeconomic History   Marital status: Single    Spouse name: Not on file   Number of  children: Not on file   Years of education: Not on file   Highest education level: Not on file  Occupational History   Not on file  Tobacco Use   Smoking status: Every Day    Current packs/day: 0.25    Average packs/day: 0.3 packs/day for 14.0 years (3.5 ttl pk-yrs)    Types: Cigarettes   Smokeless tobacco: Never   Tobacco comments:    Started smoking age 75.  No cigs since 05/13/21  Vaping Use   Vaping status: Never Used  Substance and Sexual Activity   Alcohol use: Not Currently   Drug use: Not Currently   Sexual activity: Not on file  Other Topics Concern   Not on file  Social History Narrative   Lives in [place] with son. Smoke 5- cig/day; No alcohol; works at [employer].    Social Drivers of Corporate investment banker Strain: Not on file  Food Insecurity: Not on file  Transportation Needs: Not on file  Physical Activity: Not on file  Stress: Not on file  Social Connections: Not on file  Intimate Partner Violence: Not on file    FAMILY HISTORY: Family History  Problem Relation Age of Onset   Cancer Maternal Grandfather    Cancer Paternal Grandmother    Pancreatic cancer Paternal Grandmother     ALLERGIES:  has no known allergies.  MEDICATIONS:  Current Outpatient Medications  Medication Sig Dispense Refill   Cholecalciferol 50 MCG (2000 UT) TABS Take 1 tablet by mouth daily.     dicyclomine  (BENTYL ) 10 MG capsule Take 1 capsule (10 mg total) by mouth every 8 (eight) hours as needed. 12 capsule 0   famotidine  (PEPCID ) 20 MG tablet Take 1 tablet (20 mg total) by mouth 2 (two) times daily for 15 days. 30 tablet 0   Lidocaine  3 % CREA Apply thin film to affected area twice daily as needed for pain 28 g 0   losartan-hydrochlorothiazide (HYZAAR) 100-25 MG tablet Take 1 tablet by mouth daily.     ondansetron  (ZOFRAN -ODT) 4 MG disintegrating tablet Take 1 tablet (4 mg total) by mouth every 8 (eight) hours as needed. 12 tablet 0   cyclobenzaprine  (FLEXERIL ) 5 MG tablet  Take 1 tablet (5 mg total) by mouth 3 (three) times daily as needed for muscle spasms (back pain). (Patient not taking: Reported on 12/21/2023) 20 tablet 0   omeprazole  (PRILOSEC) 40 MG capsule Take 1 capsule (40 mg total) by mouth 2 (two) times daily before a meal. (Patient not taking: Reported on 12/21/2023) 60 capsule 2   No current facility-administered medications for this visit.      PHYSICAL EXAMINATION:   Vitals:   12/21/23 1034 12/21/23 1040  BP: (!) 172/131 (!) 159/110  Pulse: (!) 57   Resp: 16   Temp: (!) 96.4 F (35.8 C)      Filed Weights   12/21/23 1040  Weight: 193 lb (87.5 kg)      Physical Exam Vitals and nursing note reviewed.  Constitutional:      Comments: Ambulating: independently;   Alone  HENT:     Head: Normocephalic and atraumatic.     Mouth/Throat:     Pharynx: Oropharynx is clear.  Eyes:     Extraocular Movements: Extraocular movements intact.     Pupils: Pupils are equal, round, and reactive to  light.  Cardiovascular:     Rate and Rhythm: Normal rate and regular rhythm.  Pulmonary:     Comments: Decreased breath sounds bilaterally.  Abdominal:     Palpations: Abdomen is soft.  Musculoskeletal:        General: Normal range of motion.     Cervical back: Normal range of motion.  Skin:    General: Skin is warm.  Neurological:     General: No focal deficit present.     Mental Status: She is alert and oriented to person, place, and time.  Psychiatric:        Behavior: Behavior normal.        Judgment: Judgment normal.     LABORATORY DATA:  I have reviewed the data as listed Lab Results  Component Value Date   WBC 4.3 12/21/2023   HGB 13.5 12/21/2023   HCT 40.8 12/21/2023   MCV 95.3 12/21/2023   PLT 311 12/21/2023   Recent Labs    09/25/23 1006 11/13/23 1352 12/21/23 1013  NA 138 140 134*  K 5.1 4.7 4.6  CL 113* 115* 110  CO2 17* 17* 18*  GLUCOSE 95 94 92  BUN 24* 30* 31*  CREATININE 2.53* 2.16* 2.53*  CALCIUM 10.1  10.5* 9.8  GFRNONAA 23* 28* 23*  PROT 7.1 8.0  --   ALBUMIN 3.2* 3.7  --   AST 25 23  --   ALT 15 17  --   ALKPHOS 83 104  --   BILITOT 0.9 0.8  --      No results found.  Symptomatic anemia #Iron  deficient anemia [ferritin 3.5/hemoglobin 8.2-June 2022; [facility] neph[facility]]. S/p IV iron  infusion- improved. AUG 2024- iron  sat-6%; ferritin-6. Iron  deficiency on oral iron  therapy Iron  deficiency managed with oral iron  supplements. Hemoglobin stable at 13.5, indicating good response. - Continue oral iron  supplementation as needed. - Monitor hemoglobin levels annually.  # Chronic diarrhea and abdominal pain- Chronic diarrhea and abdominal pain for two months with CT scan indicating small bowel infection and possible fistula. - Contact primary care physician to discuss diarrhea and abdominal pain.  Defer to PCP- Arrange referral to gastroenterologist for further evaluation.  # Chronic kidney disease stage 4 [[Dr.Murphy-]]-  Diarrhea may affect kidney function, but current labs show no significant changes. - Ensure follow-up with nephrologist at [facility].  #Elevated blood pressure-150-140s at home. again recommend compliance with her blood pressure medications; close follow-up with nephrology. Stable.   #Active smoker:counselled to quit smoking/cutting back [3 cig/day]- stable.   # DISPOSITION: # HOLD Venofer  today # follow up in 12 months MD; labs- cbc/bmp; Iron  studies/ferritin; possible venfoer--Dr.B  All questions were answered. The patient knows to call the clinic with any problems, questions or concerns.    Cindy JONELLE Joe, MD 12/21/2023 11:30 AM

## 2024-03-18 ENCOUNTER — Emergency Department
Admission: EM | Admit: 2024-03-18 | Discharge: 2024-03-18 | Disposition: A | Discharge status: Home / Self Care | Payer: Self-pay | Attending: Emergency Medicine | Admitting: Emergency Medicine

## 2024-03-18 DIAGNOSIS — B9689 Other specified bacterial agents as the cause of diseases classified elsewhere: Secondary | ICD-10-CM | POA: Diagnosis not present

## 2024-03-18 DIAGNOSIS — K6289 Other specified diseases of anus and rectum: Secondary | ICD-10-CM | POA: Diagnosis present

## 2024-03-18 DIAGNOSIS — N760 Acute vaginitis: Secondary | ICD-10-CM | POA: Insufficient documentation

## 2024-03-18 DIAGNOSIS — I129 Hypertensive chronic kidney disease with stage 1 through stage 4 chronic kidney disease, or unspecified chronic kidney disease: Secondary | ICD-10-CM | POA: Diagnosis not present

## 2024-03-18 DIAGNOSIS — K644 Residual hemorrhoidal skin tags: Secondary | ICD-10-CM | POA: Insufficient documentation

## 2024-03-18 DIAGNOSIS — N189 Chronic kidney disease, unspecified: Secondary | ICD-10-CM | POA: Diagnosis not present

## 2024-03-18 LAB — URINALYSIS, ROUTINE W REFLEX MICROSCOPIC
Bacteria, UA: NONE SEEN
Bilirubin Urine: NEGATIVE
Glucose, UA: NEGATIVE mg/dL
Hgb urine dipstick: NEGATIVE
Ketones, ur: NEGATIVE mg/dL
Nitrite: NEGATIVE
Protein, ur: 100 mg/dL — AB
Specific Gravity, Urine: 1.009 (ref 1.005–1.030)
pH: 5 (ref 5.0–8.0)

## 2024-03-18 LAB — WET PREP, GENITAL
Sperm: NONE SEEN
Trich, Wet Prep: NONE SEEN
WBC, Wet Prep HPF POC: <10
Yeast Wet Prep HPF POC: NONE SEEN

## 2024-03-18 LAB — CHLAMYDIA/NGC RT PCR (ARMC ONLY)
Chlamydia Tr: NOT DETECTED
N gonorrhoeae: NOT DETECTED

## 2024-03-18 MED ORDER — METRONIDAZOLE 500 MG PO TABS: 500.0000 mg | ORAL_TABLET | Freq: Two times a day (BID) | ORAL | 0 refills | Status: AC

## 2024-03-18 NOTE — ED Provider Notes (Signed)
 "  [facility] Provider Note    Event Date/Time   First MD Initiated Contact with Patient 03/18/24 1720     (approximate)   History   Rectal Pain   HPI  Gina Doyle is a 49 y.o. female with PMH of HTN, IDA, PUD, CKD, GERD presents for evaluation of rectal pain.  Patient states that she has had rectal pain for the past few months.  She is aware that she has hemorrhoids.  Patient states that she thinks this is not a normal hemorrhoid as it is painful and swells up pretty big when she is having a bowel movement.  She notes occasional bleeding with bowel movements.  She also endorses abnormal vaginal discharge for a week.  She reports it is white and has a bad odor.  She is sexually active but has not had a known STD exposure.     Physical Exam   Triage Vital Signs: ED Triage Vitals [03/18/24 1611]  Encounter Vitals Group     BP (!) 204/116     Girls Systolic BP Percentile      Girls Diastolic BP Percentile      Boys Systolic BP Percentile      Boys Diastolic BP Percentile      Pulse Rate 62     Resp 18     Temp 98.1 F (36.7 C)     Temp Source Oral     SpO2 100 %     Weight 192 lb 14.4 oz (87.5 kg)     Height 5' 9 (1.753 m)     Head Circumference      Peak Flow      Pain Score 5     Pain Loc      Pain Education      Exclude from Growth Chart     Most recent vital signs: Vitals:   03/18/24 1611 03/18/24 1755  BP: (!) 204/116 (!) 201/124  Pulse: 62   Resp: 18   Temp: 98.1 F (36.7 C)   SpO2: 100%    General: Awake, no distress.  CV:  Good peripheral perfusion.  Resp:  Normal effort.  Abd:  No distention.  Rectal:  External hemorrhoids, no thrombosis, tender to palpation     ED Results / Procedures / Treatments   Labs (all labs ordered are listed, but only abnormal results are displayed) Labs Reviewed  WET PREP, GENITAL - Abnormal; Notable for the following components:      Result Value   Clue Cells Wet Prep HPF POC PRESENT (*)     All other components within normal limits  URINALYSIS, ROUTINE W REFLEX MICROSCOPIC - Abnormal; Notable for the following components:   Color, Urine YELLOW (*)    APPearance HAZY (*)    Protein, ur 100 (*)    Leukocytes,Ua LARGE (*)    All other components within normal limits  CHLAMYDIA/NGC RT PCR ([facility] ONLY)              PROCEDURES:  Critical Care performed: No  Procedures   MEDICATIONS ORDERED IN ED: Medications - No data to display   IMPRESSION / MDM / ASSESSMENT AND PLAN / ED COURSE  I reviewed the triage vital signs and the nursing notes.                             49 year old female presents for evaluation of rectal pain.  Blood pressure is  quite elevated although patient denies any symptoms related to this.  Vital signs stable otherwise.  Patient NAD on exam.  Differential diagnosis includes, but is not limited to, external hemorrhoid, internal hemorrhoid, thrombosed hemorrhoid, rectal prolapse, STD, BV, yeast infection.  Patient's presentation is most consistent with acute complicated illness / injury requiring diagnostic workup.  Patient has external hemorrhoid on physical exam.  It is not thrombosed.  No indication for I&D or further intervention at this time.  Will give patient information for general surgery to discuss possible excision.  She also endorses frequent diarrhea and states that she was given a GI referral for this but never heard from them.  I will give patient information for GI follow-up as I am sure the frequent diarrhea is contributing to her hemorrhoids.  Given patient's concerns for abnormal vaginal discharge, will obtain swabs for gonorrhea and chlamydia along with a wet prep.  Since patient has not had any known STD exposure will not treat empirically at this time.  Patient would prefer to follow-up with her results of the GC swab on MyChart.  She was informed that if she test positive she can seek treatment at the emergency department, urgent  care or from her primary care provider.  I emphasized the importance of her following up with her primary care provider regarding her elevated blood pressure.  She has an appointment for later this week.  Clinical Course as of 03/18/24 1833  [place] Mar 18, 2024  1823 Wet prep, genital(!) Clue cells present, consistent with bacterial vaginosis.  Will treat with antibiotics. [LD]    Clinical Course User Index [LD] Cleaster Tinnie LABOR, PA-C     FINAL CLINICAL IMPRESSION(S) / ED DIAGNOSES   Final diagnoses:  External hemorrhoid  Bacterial vaginosis     Rx / DC Orders   ED Discharge Orders          Ordered    metroNIDAZOLE  (FLAGYL ) 500 MG tablet  2 times daily        03/18/24 1825             Note:  This document was prepared using Dragon voice recognition software and may include unintentional dictation errors.   Cleaster Tinnie LABOR, PA-C 03/18/24 EASTER Jacolyn Pae, MD 03/18/24 1927  "

## 2024-03-18 NOTE — ED Notes (Signed)
 RN at bedside for rectal exam. Pt advised this has been going on for months. Pt described a hemorrhoid. Pt also advised she is having vaginal, discharge that smells like dead meat.Gina Doyle

## 2024-03-18 NOTE — Discharge Instructions (Addendum)
 Please follow-up with the results of the gonorrhea and Chlamydia testing on MyChart.  If you test positive you can seek treatment from your primary care provider, urgent care or the emergency department.  You did test positive for bacterial vaginosis.  This is treated with an antibiotic called metronidazole .  Please take all of the medication even if you begin to feel better.  I have attached follow-up information for both general surgery and GI.  I recommend following up with the general surgeon in regards to treatment options for the hemorrhoids.  I recommend following up with a GI specialist in regards to the frequent diarrhea.  Please return to the emergency department with any worsening symptoms.

## 2024-03-18 NOTE — ED Triage Notes (Addendum)
 Pt presents to the ED via POV from home for rectal pain x3 months. Pt a pimple like area that drains and smells. Denies fevers or chills. Pt has a hx of htn and reports that her BP usually runs high

## 2024-04-03 ENCOUNTER — Encounter: Payer: Self-pay | Admitting: Internal Medicine

## 2024-04-09 ENCOUNTER — Emergency Department
Admission: EM | Admit: 2024-04-09 | Discharge: 2024-04-09 | Disposition: A | Discharge status: Home / Self Care | Attending: Emergency Medicine | Admitting: Emergency Medicine

## 2024-04-09 ENCOUNTER — Encounter: Payer: Self-pay | Admitting: Emergency Medicine

## 2024-04-09 ENCOUNTER — Other Ambulatory Visit: Payer: Self-pay

## 2024-04-09 DIAGNOSIS — K2971 Gastritis, unspecified, with bleeding: Secondary | ICD-10-CM | POA: Diagnosis not present

## 2024-04-09 DIAGNOSIS — K2970 Gastritis, unspecified, without bleeding: Secondary | ICD-10-CM

## 2024-04-09 DIAGNOSIS — R1013 Epigastric pain: Secondary | ICD-10-CM | POA: Diagnosis present

## 2024-04-09 LAB — COMPREHENSIVE METABOLIC PANEL WITH GFR
ALT: 15 U/L (ref 0–44)
AST: 27 U/L (ref 15–41)
Albumin: 3.5 g/dL (ref 3.5–5.0)
Alkaline Phosphatase: 84 U/L (ref 38–126)
Anion gap: 12 (ref 5–15)
BUN: 36 mg/dL — ABNORMAL HIGH (ref 6–20)
CO2: 13 mmol/L — ABNORMAL LOW (ref 22–32)
Calcium: 9.4 mg/dL (ref 8.9–10.3)
Chloride: 112 mmol/L — ABNORMAL HIGH (ref 98–111)
Creatinine, Ser: 2.85 mg/dL — ABNORMAL HIGH (ref 0.44–1.00)
GFR, Estimated: 20 mL/min — ABNORMAL LOW
Glucose, Bld: 92 mg/dL (ref 70–99)
Potassium: 4.6 mmol/L (ref 3.5–5.1)
Sodium: 136 mmol/L (ref 135–145)
Total Bilirubin: 0.3 mg/dL (ref 0.0–1.2)
Total Protein: 6.8 g/dL (ref 6.5–8.1)

## 2024-04-09 LAB — CBC
HCT: 44.1 % (ref 36.0–46.0)
Hemoglobin: 14.7 g/dL (ref 12.0–15.0)
MCH: 31.7 pg (ref 26.0–34.0)
MCHC: 33.3 g/dL (ref 30.0–36.0)
MCV: 95.2 fL (ref 80.0–100.0)
Platelets: 239 10*3/uL (ref 150–400)
RBC: 4.63 MIL/uL (ref 3.87–5.11)
RDW: 14.5 % (ref 11.5–15.5)
WBC: 4.1 10*3/uL (ref 4.0–10.5)
nRBC: 0 % (ref 0.0–0.2)

## 2024-04-09 LAB — URINALYSIS, ROUTINE W REFLEX MICROSCOPIC
Bilirubin Urine: NEGATIVE
Glucose, UA: NEGATIVE mg/dL
Ketones, ur: NEGATIVE mg/dL
Leukocytes,Ua: NEGATIVE
Nitrite: NEGATIVE
Protein, ur: >=300 mg/dL — AB
Specific Gravity, Urine: 1.012 (ref 1.005–1.030)
WBC, UA: 0 WBC/hpf (ref 0–5)
pH: 5 (ref 5.0–8.0)

## 2024-04-09 LAB — LIPASE, BLOOD: Lipase: 55 U/L — ABNORMAL HIGH (ref 11–51)

## 2024-04-09 MED ORDER — LIDOCAINE VISCOUS HCL 2 % MT SOLN
15.0000 mL | Freq: Once | OROMUCOSAL | Status: AC
  Administered 2024-04-09: 15 mL via OROMUCOSAL
  Filled 2024-04-09: qty 15

## 2024-04-09 MED ORDER — FENTANYL CITRATE (PF) 50 MCG/ML IJ SOSY
50.0000 ug | PREFILLED_SYRINGE | Freq: Once | INTRAMUSCULAR | Status: AC
  Administered 2024-04-09: 50 ug via INTRAVENOUS
  Filled 2024-04-09: qty 1

## 2024-04-09 MED ORDER — SUCRALFATE 1 G PO TABS: 1.0000 g | ORAL_TABLET | Freq: Four times a day (QID) | ORAL | 1 refills | Status: AC

## 2024-04-09 MED ORDER — LIDOCAINE VISCOUS HCL 2 % MT SOLN: 15.0000 mL | Freq: Four times a day (QID) | OROMUCOSAL | 1 refills | Status: AC | PRN

## 2024-04-09 MED ORDER — SODIUM CHLORIDE 0.9 % IV BOLUS
1000.0000 mL | Freq: Once | INTRAVENOUS | Status: AC
  Administered 2024-04-09: 1000 mL via INTRAVENOUS

## 2024-04-09 MED ORDER — FAMOTIDINE 20 MG PO TABS: 20.0000 mg | ORAL_TABLET | Freq: Two times a day (BID) | ORAL | 0 refills | Status: AC

## 2024-04-09 MED ORDER — ALUM & MAG HYDROXIDE-SIMETH 200-200-20 MG/5ML PO SUSP
30.0000 mL | Freq: Once | ORAL | Status: AC
  Administered 2024-04-09: 30 mL via ORAL
  Filled 2024-04-09: qty 30

## 2024-04-09 MED ORDER — LIDOCAINE VISCOUS HCL 2 % MT SOLN
15.0000 mL | Freq: Once | OROMUCOSAL | Status: AC
  Administered 2024-04-09: 15 mL via ORAL
  Filled 2024-04-09: qty 15

## 2024-04-09 NOTE — ED Triage Notes (Signed)
 Presents with upper abd  pain   States pain started yesterday  Became worse this am Pos nausea  No vomiting or diarrhea  denies any fever Hx of same  Possible ulcer

## 2024-04-09 NOTE — ED Provider Notes (Signed)
 "  [facility] Provider Note    Event Date/Time   First MD Initiated Contact with Patient 04/09/24 [phone]     (approximate)   History   Abdominal Pain   HPI  Gina Doyle is a 49 y.o. female who presents to the emergency department today because of concerns for epigastric pain.  Patient stated that the pain started yesterday evening.  It became worse overnight.  It is located in the epigastric region and radiates to her back.  It is severe.  She describes it as burning. Has some nausea. Denies any fevers.      Physical Exam   Triage Vital Signs: ED Triage Vitals  Encounter Vitals Group     BP 04/09/24 0948 (!) 159/113     Girls Systolic BP Percentile --      Girls Diastolic BP Percentile --      Boys Systolic BP Percentile --      Boys Diastolic BP Percentile --      Pulse Rate 04/09/24 0948 64     Resp 04/09/24 0948 18     Temp 04/09/24 0948 98.3 F (36.8 C)     Temp Source 04/09/24 0948 Oral     SpO2 04/09/24 0948 98 %     Weight 04/09/24 0944 194 lb 0.1 oz (88 kg)     Height 04/09/24 0944 5' 9 (1.753 m)     Head Circumference --      Peak Flow --      Pain Score 04/09/24 0942 10     Pain Loc --      Pain Education --      Exclude from Growth Chart --     Most recent vital signs: Vitals:   04/09/24 0948  BP: (!) 159/113  Pulse: 64  Resp: 18  Temp: 98.3 F (36.8 C)  SpO2: 98%   General: Awake, alert, oriented. CV:  Good peripheral perfusion. Regular rate and rhythm. Resp:  Normal effort. Lungs clear. Abd:  No distention. Tender in the epigastric region   ED Results / Procedures / Treatments   Labs (all labs ordered are listed, but only abnormal results are displayed) Labs Reviewed  LIPASE, BLOOD - Abnormal; Notable for the following components:      Result Value   Lipase 55 (*)    All other components within normal limits  COMPREHENSIVE METABOLIC PANEL WITH GFR - Abnormal; Notable for the following components:   Chloride  112 (*)    CO2 13 (*)    BUN 36 (*)    Creatinine, Ser 2.85 (*)    GFR, Estimated 20 (*)    All other components within normal limits  URINALYSIS, ROUTINE W REFLEX MICROSCOPIC - Abnormal; Notable for the following components:   Color, Urine YELLOW (*)    APPearance HAZY (*)    Hgb urine dipstick LARGE (*)    Protein, ur >=300 (*)    Bacteria, UA RARE (*)    All other components within normal limits  CBC     EKG  None   RADIOLOGY None  PROCEDURES:  Critical Care performed: No    MEDICATIONS ORDERED IN ED: Medications - No data to display   IMPRESSION / MDM / ASSESSMENT AND PLAN / ED COURSE  I reviewed the triage vital signs and the nursing notes.  Differential diagnosis includes, but is not limited to, pancreatitis, gallbladder disease, gastritis, gastroenteritis  Patient's presentation is most consistent with acute presentation with potential threat to life or bodily function.   Patient presented to the emergency department today because of concerns for epigastric pain.  Patient is afebrile here.  She is tender around the epigastric region.  Blood work here with no concerning leukocytosis.  Very minimal lipase elevation.  Patient did feel significant improvement after medication here.  At this time given patient's history I do think gastritis likely.  I discussed this with the patient.  I would have lower concern for significant pancreatitis.  Given clinical improvement I do think it is reasonable for patient be discharged.  Will give patient prescription for medications to help with symptoms.  Encouraged patient to follow-up with GI.     FINAL CLINICAL IMPRESSION(S) / ED DIAGNOSES   Final diagnoses:  Gastritis, presence of bleeding unspecified, unspecified chronicity, unspecified gastritis type     Note:  This document was prepared using Dragon voice recognition software and may include unintentional dictation errors.    Floy Roberts, MD 04/09/24 1133  "

## 2024-04-09 NOTE — ED Notes (Signed)
 Verified correct patient and correct discharge papers given. Pt alert and oriented X 4, stable for discharge. RR even and unlabored, color WNL. Discussed discharge instructions and follow-up as directed. Discharge medications discussed, when prescribed. Pt had opportunity to ask questions, and RN available to provide patient and/or family education.  Pt has visitor in room driving her home.

## 2024-04-09 NOTE — ED Notes (Signed)
 Pt requesting medication before DC for pain. EDP notified.

## 2024-12-20 ENCOUNTER — Other Ambulatory Visit: Payer: Self-pay

## 2024-12-20 ENCOUNTER — Ambulatory Visit: Payer: Self-pay | Admitting: Internal Medicine

## 2024-12-20 ENCOUNTER — Ambulatory Visit: Payer: Self-pay
